# Patient Record
Sex: Female | Born: 1970 | Race: White | Hispanic: No | Marital: Married
Health system: Midwestern US, Community
[De-identification: ages and names within clinical notes are randomized; demographics above are authoritative.]

## PROBLEM LIST (undated history)

## (undated) DIAGNOSIS — J0100 Acute maxillary sinusitis, unspecified: Secondary | ICD-10-CM

## (undated) DIAGNOSIS — G4730 Sleep apnea, unspecified: Secondary | ICD-10-CM

## (undated) DIAGNOSIS — R51 Headache: Secondary | ICD-10-CM

## (undated) DIAGNOSIS — R519 Headache, unspecified: Secondary | ICD-10-CM

## (undated) DIAGNOSIS — J329 Chronic sinusitis, unspecified: Secondary | ICD-10-CM

## (undated) DIAGNOSIS — G4700 Insomnia, unspecified: Secondary | ICD-10-CM

## (undated) DIAGNOSIS — E559 Vitamin D deficiency, unspecified: Secondary | ICD-10-CM

## (undated) HISTORY — DX: Vitamin D deficiency, unspecified: E55.9

## (undated) HISTORY — DX: Insomnia, unspecified: G47.00

## (undated) HISTORY — DX: Acute maxillary sinusitis, unspecified: J01.00

## (undated) HISTORY — PX: ABDOMINAL HYSTERECTOMY: SHX81

## (undated) HISTORY — PX: APPENDECTOMY: SHX54

---

## 1997-12-16 HISTORY — PX: BREAST REDUCTION SURGERY: SHX8

## 2007-06-10 ENCOUNTER — Inpatient Hospital Stay: Payer: Self-pay | Admitting: Internal Medicine

## 2007-06-11 ENCOUNTER — Other Ambulatory Visit: Payer: Self-pay

## 2009-05-03 ENCOUNTER — Ambulatory Visit: Payer: Self-pay | Admitting: Cardiology

## 2009-05-03 ENCOUNTER — Ambulatory Visit: Payer: Self-pay | Admitting: Unknown Physician Specialty

## 2009-05-11 ENCOUNTER — Ambulatory Visit: Payer: Self-pay | Admitting: Unknown Physician Specialty

## 2009-06-29 ENCOUNTER — Inpatient Hospital Stay: Payer: Self-pay | Admitting: Unknown Physician Specialty

## 2010-06-07 ENCOUNTER — Ambulatory Visit: Payer: Self-pay | Admitting: Internal Medicine

## 2011-05-06 ENCOUNTER — Ambulatory Visit: Payer: Self-pay | Admitting: Family Medicine

## 2011-09-05 ENCOUNTER — Ambulatory Visit: Payer: Self-pay | Admitting: Family Medicine

## 2011-09-26 ENCOUNTER — Ambulatory Visit: Payer: Self-pay | Admitting: Family Medicine

## 2011-12-17 HISTORY — PX: GASTRIC BYPASS: SHX52

## 2012-12-16 HISTORY — PX: HERNIA REPAIR: SHX51

## 2014-05-10 ENCOUNTER — Ambulatory Visit: Payer: Self-pay | Admitting: Family Medicine

## 2014-07-05 ENCOUNTER — Ambulatory Visit: Payer: Self-pay | Admitting: Family Medicine

## 2016-05-20 ENCOUNTER — Encounter: Payer: Self-pay | Admitting: Family Medicine

## 2016-05-20 ENCOUNTER — Ambulatory Visit (INDEPENDENT_AMBULATORY_CARE_PROVIDER_SITE_OTHER): Payer: Managed Care, Other (non HMO) | Admitting: Family Medicine

## 2016-05-20 VITALS — BP 120/68 | HR 71 | Temp 98.2°F | Resp 20 | Ht 64.0 in | Wt 159.4 lb

## 2016-05-20 DIAGNOSIS — J309 Allergic rhinitis, unspecified: Secondary | ICD-10-CM | POA: Diagnosis not present

## 2016-05-20 DIAGNOSIS — E663 Overweight: Secondary | ICD-10-CM | POA: Insufficient documentation

## 2016-05-20 DIAGNOSIS — E559 Vitamin D deficiency, unspecified: Secondary | ICD-10-CM

## 2016-05-20 DIAGNOSIS — G43709 Chronic migraine without aura, not intractable, without status migrainosus: Secondary | ICD-10-CM | POA: Diagnosis not present

## 2016-05-20 DIAGNOSIS — Z9884 Bariatric surgery status: Secondary | ICD-10-CM

## 2016-05-20 DIAGNOSIS — Z9889 Other specified postprocedural states: Secondary | ICD-10-CM

## 2016-05-20 MED ORDER — SUMATRIPTAN SUCCINATE 6 MG/0.5ML ~~LOC~~ SOSY: 6.0000 mg | PREFILLED_SYRINGE | SUBCUTANEOUS | Status: DC | PRN

## 2016-05-20 MED ORDER — TOPIRAMATE 50 MG PO TABS: 50.0000 mg | ORAL_TABLET | Freq: Two times a day (BID) | ORAL | Status: DC

## 2016-05-20 MED ORDER — IBUPROFEN 800 MG PO TABS: 800.0000 mg | ORAL_TABLET | Freq: Three times a day (TID) | ORAL | Status: DC | PRN

## 2016-05-21 DIAGNOSIS — J309 Allergic rhinitis, unspecified: Secondary | ICD-10-CM | POA: Insufficient documentation

## 2016-05-21 DIAGNOSIS — G43909 Migraine, unspecified, not intractable, without status migrainosus: Secondary | ICD-10-CM | POA: Insufficient documentation

## 2016-05-21 DIAGNOSIS — E559 Vitamin D deficiency, unspecified: Secondary | ICD-10-CM | POA: Insufficient documentation

## 2016-05-21 LAB — COMPREHENSIVE METABOLIC PANEL
A/G RATIO: 1.8 (ref 1.2–2.2)
ALK PHOS: 90 IU/L (ref 39–117)
ALT: 19 IU/L (ref 0–32)
AST: 18 IU/L (ref 0–40)
Albumin: 4.6 g/dL (ref 3.5–5.5)
BUN/Creatinine Ratio: 14 (ref 9–23)
BUN: 9 mg/dL (ref 6–24)
Bilirubin Total: 0.7 mg/dL (ref 0.0–1.2)
CO2: 29 mmol/L (ref 18–29)
Calcium: 9.6 mg/dL (ref 8.7–10.2)
Chloride: 101 mmol/L (ref 96–106)
Creatinine, Ser: 0.64 mg/dL (ref 0.57–1.00)
GFR calc Af Amer: 126 mL/min/{1.73_m2} (ref >59)
GFR calc non Af Amer: 109 mL/min/{1.73_m2} (ref >59)
Globulin, Total: 2.5 g/dL (ref 1.5–4.5)
Glucose: 89 mg/dL (ref 65–99)
POTASSIUM: 4.9 mmol/L (ref 3.5–5.2)
SODIUM: 141 mmol/L (ref 134–144)
TOTAL PROTEIN: 7.1 g/dL (ref 6.0–8.5)

## 2016-05-21 LAB — CBC
Hematocrit: 42.5 % (ref 34.0–46.6)
Hemoglobin: 14.3 g/dL (ref 11.1–15.9)
MCH: 30.2 pg (ref 26.6–33.0)
MCHC: 33.6 g/dL (ref 31.5–35.7)
MCV: 90 fL (ref 79–97)
PLATELETS: 330 10*3/uL (ref 150–379)
RBC: 4.74 x10E6/uL (ref 3.77–5.28)
RDW: 13.9 % (ref 12.3–15.4)
WBC: 6.2 10*3/uL (ref 3.4–10.8)

## 2016-05-21 LAB — LIPID PANEL
Chol/HDL Ratio: 3.1 ratio units (ref 0.0–4.4)
Cholesterol, Total: 150 mg/dL (ref 100–199)
HDL: 48 mg/dL (ref >39)
LDL CALC: 88 mg/dL (ref 0–99)
Triglycerides: 72 mg/dL (ref 0–149)
VLDL CHOLESTEROL CAL: 14 mg/dL (ref 5–40)

## 2016-05-21 LAB — VITAMIN B12: Vitamin B-12: 744 pg/mL (ref 211–946)

## 2016-05-21 LAB — VITAMIN D 25 HYDROXY (VIT D DEFICIENCY, FRACTURES): Vit D, 25-Hydroxy: 26.6 ng/mL — ABNORMAL LOW (ref 30.0–100.0)

## 2016-05-21 LAB — TSH: TSH: 1.02 u[IU]/mL (ref 0.450–4.500)

## 2016-05-21 MED ORDER — VITAMIN D3 25 MCG (1000 UT) PO CAPS: 1.0000 | ORAL_CAPSULE | Freq: Every day | ORAL | Status: DC

## 2016-05-21 NOTE — Addendum Note (Signed)
Addended by: Schuyler AmorPLONK, Takeela Peil on: 05/21/2016 05:25 PM   Modules accepted: Orders

## 2016-05-21 NOTE — Progress Notes (Signed)
Date:  05/20/2016   Name:  Precious ReelKerri D Lamb   DOB:  01/07/1971   MRN:  [id number]  PCP:  Dennison MascotLemont Morrisey, MD    Chief Complaint: Migraine   History of Present Illness:  This is a 45 y.o. female with chronic migraines, requests refills on SQ Imitrex (oral ineffective). Having migraines every two weeks, on Topamax in past which helped. Also takes ibuprofen 800 mg prn headache. C/o AR sxs, has not tried oral antihistamine and steroid NS together. Hx gastric bypass with vit D def in past.  Review of Systems:  Review of Systems  Constitutional: Negative for fever and fatigue.  Respiratory: Negative for cough and shortness of breath.   Cardiovascular: Negative for chest pain and leg swelling.  Neurological: Negative for syncope and light-headedness.    Patient Active Problem List   Diagnosis Date Noted  . Migraines 05/21/2016  . Vitamin D deficiency 05/21/2016  . Allergic rhinitis 05/21/2016  . Overweight (BMI 25.0-29.9) 05/20/2016  . Hx of gastric bypass 05/20/2016    Prior to Admission medications   Medication Sig Start Date End Date Taking? Authorizing Provider  ibuprofen (ADVIL,MOTRIN) 800 MG tablet Take 1 tablet (800 mg total) by mouth every 8 (eight) hours as needed. 05/20/16   Schuyler AmorWilliam Lynsi Dooner, MD  SUMAtriptan (IMITREX) 6 MG/0.5ML SOSY injection Inject 0.5 mLs (6 mg total) into the skin every 2 (two) hours as needed for migraine or headache. F 05/20/16   Schuyler AmorWilliam Keyli Duross, MD  topiramate (TOPAMAX) 50 MG tablet Take 1 tablet (50 mg total) by mouth 2 (two) times daily. 05/20/16   Schuyler AmorWilliam Houda Brau, MD    Allergies  Allergen Reactions  . Sulfur Rash    Past Surgical History  Procedure Laterality Date  . Gastric bypass    . Abdominal hysterectomy    . Appendectomy    . Breast reduction surgery    . Gallbladder surgery      Social History  Substance Use Topics  . Smoking status: Never Smoker   . Smokeless tobacco: None  . Alcohol Use: No    Family History  Problem Relation Age of  Onset  . Cancer Mother   . Diabetes Father   . Hypertension Father   . Hyperlipidemia Father     Medication list has been reviewed and updated.  Physical Examination: BP 120/68 mmHg  Pulse 71  Temp(Src) 98.2 F (36.8 C)  Resp 20  Ht 5\' 4"  (1.626 m)  Wt 159 lb 6 oz (72.292 kg)  BMI 27.34 kg/m2  SpO2 98%  Physical Exam  Constitutional: She appears well-developed and well-nourished.  Cardiovascular: Normal rate, regular rhythm and normal heart sounds.   Pulmonary/Chest: Effort normal and breath sounds normal.  Musculoskeletal: She exhibits no edema.  Neurological: She is alert.  Skin: Skin is warm and dry.  Psychiatric: She has a normal mood and affect. Her behavior is normal.  Nursing note and vitals reviewed.   Assessment and Plan:  1. Chronic migraine without aura without status migrainosus, not intractable Refill SQ Imitrex and ibuprofen, restart Topamax  2. Overweight (BMI 25.0-29.9) S/p gastric bypass - TSH - Lipid Profile  3. Vitamin D deficiency Check level  4. Allergic rhinitis, unspecified allergic rhinitis type Try OTC Claritin/Flonase together, consider ENT referral if sxs persist  5. Hx of gastric bypass - B12 - Vitamin D (25 hydroxy) - Comprehensive Metabolic Panel (CMET) - CBC  Return in about 6 months (around 11/19/2016).  Dionne AnoWilliam M. Kingsley SpittlePlonk, Jr. MD [facility]  05/21/2016

## 2016-05-24 ENCOUNTER — Other Ambulatory Visit: Payer: Self-pay | Admitting: Family Medicine

## 2016-05-24 ENCOUNTER — Telehealth: Payer: Self-pay

## 2016-05-24 MED ORDER — VITAMIN D3 125 MCG (5000 UT) PO CAPS: 1.0000 | ORAL_CAPSULE | Freq: Every day | ORAL | Status: DC

## 2016-05-24 NOTE — Telephone Encounter (Signed)
Pt stated that she has been on OTC vitamin D 3000 units per day and she stated that she was diagnosed with osteopenia years ago and was placed on 50000 units and she wanted to know if she should go back to that dosage and if so if you could send it to her pharmacy

## 2016-05-24 NOTE — Telephone Encounter (Signed)
Recommend increase OTC vit D3 to 5000 units daily, don't think she needs rx vitamin D at this time

## 2016-05-27 NOTE — Telephone Encounter (Signed)
Pt informed

## 2016-12-03 ENCOUNTER — Other Ambulatory Visit: Payer: Self-pay | Admitting: Family Medicine

## 2016-12-16 HISTORY — PX: GALLBLADDER SURGERY: SHX652

## 2017-02-07 ENCOUNTER — Encounter: Payer: Self-pay | Admitting: Family Medicine

## 2017-02-07 ENCOUNTER — Ambulatory Visit (INDEPENDENT_AMBULATORY_CARE_PROVIDER_SITE_OTHER): Payer: BLUE CROSS/BLUE SHIELD | Admitting: Family Medicine

## 2017-02-07 VITALS — BP 124/82 | HR 89 | Temp 99.0°F | Resp 16 | Ht 64.0 in | Wt 155.8 lb

## 2017-02-07 DIAGNOSIS — J0100 Acute maxillary sinusitis, unspecified: Secondary | ICD-10-CM | POA: Insufficient documentation

## 2017-02-07 DIAGNOSIS — G43709 Chronic migraine without aura, not intractable, without status migrainosus: Secondary | ICD-10-CM | POA: Diagnosis not present

## 2017-02-07 HISTORY — DX: Acute maxillary sinusitis, unspecified: J01.00

## 2017-02-07 MED ORDER — SUMATRIPTAN SUCCINATE 6 MG/0.5ML ~~LOC~~ SOSY: 6.0000 mg | PREFILLED_SYRINGE | Freq: Once | SUBCUTANEOUS | 3 refills | Status: DC

## 2017-02-07 MED ORDER — FLUTICASONE PROPIONATE 50 MCG/ACT NA SUSP: 2.0000 | Freq: Every day | NASAL | 2 refills | Status: DC

## 2017-02-07 MED ORDER — IBUPROFEN 800 MG PO TABS: 800.0000 mg | ORAL_TABLET | Freq: Three times a day (TID) | ORAL | 0 refills | Status: DC | PRN

## 2017-02-07 MED ORDER — AZITHROMYCIN 250 MG PO TABS: ORAL_TABLET | ORAL | 0 refills | Status: DC

## 2017-02-07 NOTE — Progress Notes (Signed)
Name: Vanessa Callahan   MRN: [id number]    DOB: 06/08/1971   Date:02/07/2017       Progress Note  Subjective  Chief Complaint  Chief Complaint  Patient presents with  . Sinusitis    cough, congested, headache, ears itching, sore throat for 5 days    Sinusitis  This is a new problem. The current episode started in the past 7 days (4 days ago). The problem is unchanged. There has been no fever. Associated symptoms include congestion, headaches, sinus pressure, sneezing and a sore throat (drainage in her throat). Treatments tried: Claritin D, Zyrtec D, Dayquil cold and flu, and Nettipot. The treatment provided no relief.  She is also requesting refill for sumatriptan and ibuprofen, taken for chronic migraine headaches, sumatriptan helps relieve migraines when taken at onset.  Past Medical History:  Diagnosis Date  . Anxiety   . Depression   . Insomnia   . Vitamin D deficiency     Past Surgical History:  Procedure Laterality Date  . ABDOMINAL HYSTERECTOMY    . APPENDECTOMY    . BREAST REDUCTION SURGERY    . GALLBLADDER SURGERY    . GASTRIC BYPASS      Family History  Problem Relation Age of Onset  . Cancer Mother   . Diabetes Father   . Hypertension Father   . Hyperlipidemia Father     Social History   Social History  . Marital status: Married    Spouse name: N/A  . Number of children: N/A  . Years of education: N/A   Occupational History  . Not on file.   Social History Main Topics  . Smoking status: Never Smoker  . Smokeless tobacco: Never Used  . Alcohol use No  . Drug use: Unknown  . Sexual activity: Yes    Birth control/ protection: None   Other Topics Concern  . Not on file   Social History Narrative  . No narrative on file     Current Outpatient Prescriptions:  .  Cholecalciferol (VITAMIN D3) 5000 units CAPS, Take 1 capsule (5,000 Units total) by mouth daily., Disp: 30 capsule, Rfl:  .  ibuprofen (ADVIL,MOTRIN) 800 MG tablet, Take 1 tablet (800 mg  total) by mouth every 8 (eight) hours as needed., Disp: 30 tablet, Rfl: 0 .  SUMAtriptan (IMITREX) 6 MG/0.5ML SOSY injection, Inject 0.5 mLs (6 mg total) into the skin every 2 (two) hours as needed for migraine or headache. F (Patient not taking: Reported on 02/07/2017), Disp: 6 Syringe, Rfl: 3 .  topiramate (TOPAMAX) 50 MG tablet, Take 1 tablet (50 mg total) by mouth 2 (two) times daily. (Patient not taking: Reported on 02/07/2017), Disp: 60 tablet, Rfl: 2  Allergies  Allergen Reactions  . Sulfur Rash     Review of Systems  HENT: Positive for congestion, sinus pressure, sneezing and sore throat (drainage in her throat).   Neurological: Positive for headaches.     Objective  Vitals:   02/07/17 1054  BP: 124/82  Pulse: 89  Resp: 16  Temp: 99 F (37.2 C)  TempSrc: Oral  SpO2: 98%  Weight: 155 lb 12.8 oz (70.7 kg)  Height: 5\' 4"  (1.626 m)    Physical Exam  Constitutional: She is oriented to person, place, and time and well-developed, well-nourished, and in no distress.  HENT:  Right Ear: Tympanic membrane and ear canal normal. No drainage or swelling.  Left Ear: Tympanic membrane and ear canal normal. No drainage or swelling.  Nose: Right sinus  exhibits maxillary sinus tenderness. Right sinus exhibits no frontal sinus tenderness. Left sinus exhibits maxillary sinus tenderness. Left sinus exhibits no frontal sinus tenderness.  Mouth/Throat: Posterior oropharyngeal erythema present. No oropharyngeal exudate or posterior oropharyngeal edema.  Nasal mucosal inflammation, turbinates hypertrophied.  Cardiovascular: Normal rate, regular rhythm, S1 normal, S2 normal and normal heart sounds.   No murmur heard. Pulmonary/Chest: Effort normal and breath sounds normal. She has no wheezes. She has no rhonchi.  Neurological: She is alert and oriented to person, place, and time.  Psychiatric: Mood, memory, affect and judgment normal.  Nursing note and vitals reviewed.    Assessment &  Plan  1. Chronic migraine without aura without status migrainosus, not intractable  - SUMAtriptan (IMITREX) 6 MG/0.5ML SOSY injection; Inject 0.5 mLs (6 mg total) into the skin once.  Dispense: 6 Syringe; Refill: 3 - ibuprofen (ADVIL,MOTRIN) 800 MG tablet; Take 1 tablet (800 mg total) by mouth every 8 (eight) hours as needed for moderate pain.  Dispense: 30 tablet; Refill: 0  2. Acute non-recurrent maxillary sinusitis  - fluticasone (FLONASE ALLERGY RELIEF) 50 MCG/ACT nasal spray; Place 2 sprays into both nostrils daily.  Dispense: 16 g; Refill: 2 - azithromycin (ZITHROMAX) 250 MG tablet; 2 tabs po day 1, then 1 tab po q day x 4 days  Dispense: 6 each; Refill: 0   Jannet Calip Asad A. Faylene KurtzShah [facility] 02/07/2017 11:16 AM

## 2017-02-25 ENCOUNTER — Telehealth: Payer: Self-pay | Admitting: Family Medicine

## 2017-02-25 NOTE — Telephone Encounter (Signed)
PA was performed on 02/11/17, but it was denied by insurance. Patient must call them to find out why.

## 2017-02-25 NOTE — Telephone Encounter (Signed)
Pt states that sumatriptan is needing a authorization. She is asking that you give her a call once complete [phone]

## 2017-02-25 NOTE — Telephone Encounter (Signed)
Pt will call insurance to see which sumatriptan they will cover

## 2017-11-03 DIAGNOSIS — H18829 Corneal disorder due to contact lens, unspecified eye: Secondary | ICD-10-CM | POA: Diagnosis not present

## 2017-12-17 DIAGNOSIS — J329 Chronic sinusitis, unspecified: Secondary | ICD-10-CM

## 2017-12-17 HISTORY — DX: Chronic sinusitis, unspecified: J32.9

## 2017-12-29 DIAGNOSIS — Z113 Encounter for screening for infections with a predominantly sexual mode of transmission: Secondary | ICD-10-CM | POA: Diagnosis not present

## 2017-12-29 DIAGNOSIS — R102 Pelvic and perineal pain: Secondary | ICD-10-CM | POA: Diagnosis not present

## 2017-12-29 DIAGNOSIS — Z124 Encounter for screening for malignant neoplasm of cervix: Secondary | ICD-10-CM | POA: Diagnosis not present

## 2017-12-29 DIAGNOSIS — Z01419 Encounter for gynecological examination (general) (routine) without abnormal findings: Secondary | ICD-10-CM | POA: Diagnosis not present

## 2018-01-07 DIAGNOSIS — R102 Pelvic and perineal pain: Secondary | ICD-10-CM | POA: Diagnosis not present

## 2018-03-02 ENCOUNTER — Encounter: Payer: Self-pay | Admitting: Family Medicine

## 2018-03-02 ENCOUNTER — Ambulatory Visit: Payer: BLUE CROSS/BLUE SHIELD | Admitting: Family Medicine

## 2018-03-02 VITALS — BP 110/68 | HR 79 | Temp 98.4°F | Resp 16 | Ht 64.0 in | Wt 157.7 lb

## 2018-03-02 DIAGNOSIS — J040 Acute laryngitis: Secondary | ICD-10-CM

## 2018-03-02 DIAGNOSIS — Z1239 Encounter for other screening for malignant neoplasm of breast: Secondary | ICD-10-CM

## 2018-03-02 DIAGNOSIS — E049 Nontoxic goiter, unspecified: Secondary | ICD-10-CM

## 2018-03-02 DIAGNOSIS — E663 Overweight: Secondary | ICD-10-CM

## 2018-03-02 DIAGNOSIS — Z9884 Bariatric surgery status: Secondary | ICD-10-CM

## 2018-03-02 DIAGNOSIS — E559 Vitamin D deficiency, unspecified: Secondary | ICD-10-CM | POA: Diagnosis not present

## 2018-03-02 DIAGNOSIS — G43709 Chronic migraine without aura, not intractable, without status migrainosus: Secondary | ICD-10-CM

## 2018-03-02 DIAGNOSIS — Z1231 Encounter for screening mammogram for malignant neoplasm of breast: Secondary | ICD-10-CM

## 2018-03-02 DIAGNOSIS — Z13220 Encounter for screening for lipoid disorders: Secondary | ICD-10-CM

## 2018-03-02 DIAGNOSIS — H66001 Acute suppurative otitis media without spontaneous rupture of ear drum, right ear: Secondary | ICD-10-CM | POA: Diagnosis not present

## 2018-03-02 MED ORDER — BENZONATATE 100 MG PO CAPS: 100.0000 mg | ORAL_CAPSULE | Freq: Three times a day (TID) | ORAL | 0 refills | Status: DC | PRN

## 2018-03-02 MED ORDER — FLUTICASONE PROPIONATE 50 MCG/ACT NA SUSP: 2.0000 | Freq: Every day | NASAL | 1 refills | Status: DC

## 2018-03-02 MED ORDER — PREDNISONE 10 MG PO TABS: ORAL_TABLET | ORAL | 0 refills | Status: DC

## 2018-03-02 MED ORDER — IBUPROFEN 800 MG PO TABS: 800.0000 mg | ORAL_TABLET | Freq: Three times a day (TID) | ORAL | 0 refills | Status: DC | PRN

## 2018-03-02 MED ORDER — AMOXICILLIN 500 MG PO TABS: 500.0000 mg | ORAL_TABLET | Freq: Two times a day (BID) | ORAL | 0 refills | Status: AC

## 2018-03-02 MED ORDER — SUMATRIPTAN SUCCINATE 6 MG/0.5ML ~~LOC~~ SOSY: 6.0000 mg | PREFILLED_SYRINGE | Freq: Once | SUBCUTANEOUS | 1 refills | Status: DC

## 2018-03-02 NOTE — Progress Notes (Signed)
Name: Vanessa Callahan   MRN: [id number]    DOB: 1971-04-08   Date:03/02/2018       Progress Note  Subjective  Chief Complaint  Chief Complaint  Patient presents with  . URI    cough, congested, sore throat, hoarse for 6 days  . Ear Pain    HPI  URI: PT presents with concern for URI symptoms ongoing for 6 days.  She has an extremely hoarse voice, productive cough, RIGHT otalgia with radiation into the right neck, subjective fevers/chills. Denies chest pain, shortness of breath, abdominal pain, NVD.  Has been taking ibuprofen 800mg , mucinex and claritin - these have helped, but she is just not improving.  Migraines: She is in need of Imitrex and Ibuprofen refills.  Triggers are stress, her children causing her stress, sometimes tension.  She denies aura prior to migraine onset; symptoms usually include nausea and vomiting, fatigue, and photosensitivity.  She has been having about 2 migraines a month; used to think they were menstrual, but they have remained s/p hysterectomy (still has 1 ovary in place).  She takes Ibuprofen first - if this does not help, she will take imitrex.  Enlarged Thyroid: She has enlarged thyroid on examination today - she does not think she has ever had US performed in the past - after further investigation, she has had a NM study in 2011 - found slight enlargement of the RIGHT lobe and borderline hyperthyroidism.  We will check labs and order [place].  She endorses chronic cold intolerance.  Denies hair/skin/nail changes, denies constipation or diarrhea, or palpitations; no issues with thyroid in the past that she can remember.  Labs/History of Gastric Bypass: Always has low Vitamin D, she would like this rechecked today.  She is going to schedule a physical in the upcoming 1-2 months, and would like labs done today.    Patient Active Problem List   Diagnosis Date Noted  . Acute non-recurrent maxillary sinusitis 02/07/2017  . Migraines 05/21/2016  . Vitamin D deficiency  05/21/2016  . Allergic rhinitis 05/21/2016  . Overweight (BMI 25.0-29.9) 05/20/2016  . Hx of gastric bypass 05/20/2016    Social History   Tobacco Use  . Smoking status: Never Smoker  . Smokeless tobacco: Never Used  Substance Use Topics  . Alcohol use: No     Current Outpatient Medications:  .  ibuprofen (ADVIL,MOTRIN) 800 MG tablet, Take 1 tablet (800 mg total) by mouth every 8 (eight) hours as needed for moderate pain., Disp: 30 tablet, Rfl: 0 .  azithromycin (ZITHROMAX) 250 MG tablet, 2 tabs po day 1, then 1 tab po q day x 4 days (Patient not taking: Reported on 03/02/2018), Disp: 6 each, Rfl: 0 .  Cholecalciferol (VITAMIN D3) 5000 units CAPS, Take 1 capsule (5,000 Units total) by mouth daily. (Patient not taking: Reported on 03/02/2018), Disp: 30 capsule, Rfl:  .  fluticasone (FLONASE ALLERGY RELIEF) 50 MCG/ACT nasal spray, Place 2 sprays into both nostrils daily. (Patient not taking: Reported on 03/02/2018), Disp: 16 g, Rfl: 2 .  SUMAtriptan (IMITREX) 6 MG/0.5ML SOSY injection, Inject 0.5 mLs (6 mg total) into the skin once., Disp: 6 Syringe, Rfl: 3 .  topiramate (TOPAMAX) 50 MG tablet, Take 1 tablet (50 mg total) by mouth 2 (two) times daily. (Patient not taking: Reported on 02/07/2017), Disp: 60 tablet, Rfl: 2  Allergies  Allergen Reactions  . Sulfur Rash    ROS  Ten systems reviewed and is negative except as mentioned in HPI  Objective  Vitals:   03/02/18 0919  BP: 110/68  Pulse: 79  Resp: 16  Temp: 98.4 F (36.9 C)  TempSrc: Oral  SpO2: 98%  Weight: 157 lb 11.2 oz (71.5 kg)  Height: 5\' 4"  (1.626 m)   Body mass index is 27.07 kg/m.  Nursing Note and Vital Signs reviewed.  Physical Exam  Constitutional: Patient appears well-developed and well-nourished. Obese. No distress.  HEENT: head atraumatic, normocephalic, pupils equal and reactive to light, EOM's intact, RIGHT TM bulging with purulent appearance, erythema present, no maxillary or frontal sinus  tenderness , neck supple without with mild submandibular lymphadenopathy, oropharynx pink and moist without exudate.  Notable goiter present which is particularly larger on the right side, no nodules are palpable. Cardiovascular: Normal rate, regular rhythm, S1/S2 present.  No murmur or rub heard. No BLE edema. Pulmonary/Chest: Effort normal and breath sounds clear. No respiratory distress or retractions. Psychiatric: Patient has a normal mood and affect. behavior is normal. Judgment and thought content normal. Neurological: she is alert and oriented to person, place, and time. No cranial nerve deficit. Coordination, balance, strength, speech and gait are normal.  Skin: Skin is warm and dry. No rash noted. No erythema.    No results found for this or any previous visit (from the past 72 hour(s)).  Assessment & Plan  1. Laryngitis - predniSONE (DELTASONE) 10 MG tablet; Day1:5tabs, Day2:4tabs, Day3:3tabs, Day4:2tabs, Day5:1tab  Dispense: 15 tablet; Refill: 0 - amoxicillin (AMOXIL) 500 MG tablet; Take 1 tablet (500 mg total) by mouth 2 (two) times daily for 10 days.  Dispense: 20 tablet; Refill: 0 - benzonatate (TESSALON) 100 MG capsule; Take 1-2 capsules (100-200 mg total) by mouth 3 (three) times daily as needed for cough.  Dispense: 30 capsule; Refill: 0  2. Acute suppurative otitis media of right ear without spontaneous rupture of tympanic membrane, recurrence not specified - fluticasone (FLONASE) 50 MCG/ACT nasal spray; Place 2 sprays into both nostrils daily.  Dispense: 16 g; Refill: 1 - amoxicillin (AMOXIL) 500 MG tablet; Take 1 tablet (500 mg total) by mouth 2 (two) times daily for 10 days.  Dispense: 20 tablet; Refill: 0  3. Enlarged thyroid gland - TSH - US THYROID; Future  4. Chronic migraine without aura without status migrainosus, not intractable - SUMAtriptan (IMITREX) 6 MG/0.5ML SOSY injection; Inject 0.5 mLs (6 mg total) into the skin once for 1 dose.  Dispense: 6 Syringe;  Refill: 1 - ibuprofen (ADVIL,MOTRIN) 800 MG tablet; Take 1 tablet (800 mg total) by mouth every 8 (eight) hours as needed for headache or moderate pain.  Dispense: 60 tablet; Refill: 0  5. Breast cancer screening - MM DIGITAL SCREENING BILATERAL; Future  6. Hx of gastric bypass - VITAMIN D 25 Hydroxy (Vit-D Deficiency, Fractures) - CBC - COMPLETE METABOLIC PANEL WITH GFR  7. Overweight (BMI 25.0-29.9) - Lipid panel - COMPLETE METABOLIC PANEL WITH GFR  8. Vitamin D deficiency - VITAMIN D 25 Hydroxy (Vit-D Deficiency, Fractures)  9. Screening for hyperlipidemia - Lipid panel   -Red flags and when to present for emergency care or RTC including fever >101.19F, chest pain, shortness of breath, new/worsening/un-resolving symptoms, reviewed with patient at time of visit. Follow up and care instructions discussed and provided in AVS.

## 2018-03-03 LAB — COMPLETE METABOLIC PANEL WITH GFR
AG RATIO: 1.6 (calc) (ref 1.0–2.5)
ALKALINE PHOSPHATASE (APISO): 90 U/L (ref 33–115)
ALT: 21 U/L (ref 6–29)
AST: 15 U/L (ref 10–35)
Albumin: 4.6 g/dL (ref 3.6–5.1)
BUN: 10 mg/dL (ref 7–25)
CO2: 28 mmol/L (ref 20–32)
Calcium: 9.8 mg/dL (ref 8.6–10.2)
Chloride: 106 mmol/L (ref 98–110)
Creat: 0.58 mg/dL (ref 0.50–1.10)
GFR, EST NON AFRICAN AMERICAN: 111 mL/min/{1.73_m2} (ref >=60)
GFR, Est African American: 128 mL/min/{1.73_m2} (ref >=60)
GLOBULIN: 2.8 g/dL (ref 1.9–3.7)
Glucose, Bld: 93 mg/dL (ref 65–139)
POTASSIUM: 4.3 mmol/L (ref 3.5–5.3)
SODIUM: 141 mmol/L (ref 135–146)
Total Bilirubin: 0.5 mg/dL (ref 0.2–1.2)
Total Protein: 7.4 g/dL (ref 6.1–8.1)

## 2018-03-03 LAB — LIPID PANEL
CHOLESTEROL: 135 mg/dL (ref <200)
HDL: 41 mg/dL — ABNORMAL LOW (ref >50)
LDL CHOLESTEROL (CALC): 80 mg/dL
Non-HDL Cholesterol (Calc): 94 mg/dL (calc) (ref <130)
TRIGLYCERIDES: 65 mg/dL (ref <150)
Total CHOL/HDL Ratio: 3.3 (calc) (ref <5.0)

## 2018-03-03 LAB — CBC
HEMATOCRIT: 39.3 % (ref 35.0–45.0)
Hemoglobin: 13.6 g/dL (ref 11.7–15.5)
MCH: 30.4 pg (ref 27.0–33.0)
MCHC: 34.6 g/dL (ref 32.0–36.0)
MCV: 87.7 fL (ref 80.0–100.0)
MPV: 11.7 fL (ref 7.5–12.5)
Platelets: 301 10*3/uL (ref 140–400)
RBC: 4.48 10*6/uL (ref 3.80–5.10)
RDW: 12.4 % (ref 11.0–15.0)
WBC: 9 10*3/uL (ref 3.8–10.8)

## 2018-03-03 LAB — VITAMIN D 25 HYDROXY (VIT D DEFICIENCY, FRACTURES): Vit D, 25-Hydroxy: 18 ng/mL — ABNORMAL LOW (ref 30–100)

## 2018-03-03 LAB — TSH: TSH: 1.07 mIU/L

## 2018-03-24 ENCOUNTER — Telehealth: Payer: Self-pay

## 2018-03-24 NOTE — Telephone Encounter (Signed)
Copied from CRM [id number]. Topic: Referral - Status >> Mar 24, 2018  2:12 PM Leafy RoRobinson, Norma J wrote: Reason for CRM: pt is calling back checking on status of ultrasound thyroid  The order was sent to scheduling but since it was put in as a routine, it could take up to 30 days.

## 2018-04-06 ENCOUNTER — Ambulatory Visit
Admission: RE | Admit: 2018-04-06 | Discharge: 2018-04-06 | Disposition: A | Discharge status: Home / Self Care | Payer: BLUE CROSS/BLUE SHIELD | Source: Ambulatory Visit | Attending: Family Medicine | Admitting: Family Medicine

## 2018-04-06 DIAGNOSIS — E049 Nontoxic goiter, unspecified: Secondary | ICD-10-CM | POA: Insufficient documentation

## 2018-04-06 DIAGNOSIS — E041 Nontoxic single thyroid nodule: Secondary | ICD-10-CM | POA: Diagnosis not present

## 2018-08-30 ENCOUNTER — Inpatient Hospital Stay
Admit: 2018-08-30 | Discharge: 2018-08-30 | Disposition: A | Discharge status: Home / Self Care | Payer: BLUE CROSS/BLUE SHIELD | Attending: Emergency Medicine

## 2018-08-30 DIAGNOSIS — R51 Headache: Secondary | ICD-10-CM

## 2018-08-30 DIAGNOSIS — R110 Nausea: Secondary | ICD-10-CM | POA: Diagnosis not present

## 2018-08-30 MED ORDER — SUMATRIPTAN 6 MG/0.5 ML SUB-Q
6 mg/0.5 mL | SUBCUTANEOUS | Status: AC
Start: 2018-08-30 — End: 2018-08-30
  Administered 2018-08-30: 14:00:00 via SUBCUTANEOUS

## 2018-08-30 MED FILL — SUMATRIPTAN 6 MG/0.5 ML SUB-Q: 6 mg/0.5 mL | SUBCUTANEOUS | Qty: 0.5

## 2018-08-30 NOTE — ED Triage Notes (Signed)
Pt states she has a hx of migraines, states she has had a migraine since Friday, prescription medications taken at home without relief.

## 2018-08-30 NOTE — ED Notes (Signed)
Pt states she has a hx of migraines, states she has had a migraine since Friday, prescription medications taken at home without relief.

## 2018-08-30 NOTE — ED Notes (Signed)
 I have reviewed discharge instructions with the patient.  The patient verbalized understanding.    Patient left ED via Discharge Method: ambulatory to Home with spouse.    Opportunity for questions and clarification provided.       Patient given 0 scripts.         To continue your aftercare when you leave the hospital, you may receive an automated call from our care team to check in on how you are doing.  This is a free service and part of our promise to provide the best care and service to meet your aftercare needs." If you have questions, or wish to unsubscribe from this service please call [phone].  Thank you for Choosing our [facility] Emergency Department.

## 2018-08-30 NOTE — ED Provider Notes (Signed)
[facility]     Gershon MusselKerri Denise Annie Potter is a 47 y.o. female seen on 08/30/2018 at 9:46 AM [facility] in room ER02/02.    Chief Complaint   Patient presents with   ??? Headache     HPI: 47 year old female with a history of migraine headaches presenting to the emergency department complaining of a headache.  She states it began that on Friday.  It is generalized and feels like pressure.  Is gotten progressively worse.  She takes sumatriptan injections at home and took one on Friday but states that she is concerned that she may have not deliver the medication appropriately and not gotten the full dose.  She states typically her headaches resolved almost immediately with sumatriptan.  However this time when she took the injection she did not feel "like the blood vessels dilated completely".  She has had some nausea and dry heaving but no vomiting.  No fevers.  Her headache was gradual in onset.  It feels similar to all of her previous migraine headaches.  Since she has had a prescription for the subcutaneous sumatriptan she has not had to go to the emergency department for headache in the last 3 years.  However she is traveling, having left town shortly after taking her sumatriptan.  [place]  She does not have a repeat dose to give herself at this time.  Her headache is worse with light exposure.  Also loud noises make her headache worse.    Historian: Patient    REVIEW OF SYSTEMS     Review of Systems   Constitutional: Negative for fever.   HENT: Negative.    Eyes: Negative.  Negative for visual disturbance.   Respiratory: Negative for cough, chest tightness, shortness of breath and wheezing.    Cardiovascular: Negative for chest pain.   Gastrointestinal: Positive for nausea. Negative for abdominal distention, abdominal pain, constipation, diarrhea and vomiting.   Endocrine: Negative.    Genitourinary: Negative for dysuria, flank pain, frequency and  urgency.   Neurological: Positive for headaches. Negative for dizziness and syncope.   Psychiatric/Behavioral: Negative.    All other systems reviewed and are negative.      PAST MEDICAL HISTORY     No past medical history on file.  No past surgical history on file.  Social History     Socioeconomic History   ??? Marital status: MARRIED     Spouse name: Not on file   ??? Number of children: Not on file   ??? Years of education: Not on file   ??? Highest education level: Not on file     None     No Known Allergies     PHYSICAL EXAM       Vitals:    08/30/18 0931   BP: (!) 172/94   Pulse: 71   Resp: 16   Temp: 97.8 ??F (36.6 ??C)   SpO2: 100%    Vital signs were reviewed.     Physical Exam   Constitutional: She is oriented to person, place, and time. She appears well-developed and well-nourished. No distress.   HENT:   Head: Normocephalic and atraumatic.   Eyes: Pupils are equal, round, and reactive to light. EOM are normal.   Neck: Normal range of motion. Neck supple.   No meningismus   Cardiovascular: Normal rate, regular rhythm, normal heart sounds and intact distal pulses.   No murmur heard.  Pulmonary/Chest: Effort normal and  breath sounds normal. No respiratory distress. She has no wheezes. She has no rales.   Abdominal: Soft. She exhibits no distension. There is no tenderness. There is no rebound.   Musculoskeletal: She exhibits no edema, tenderness or deformity.   Neurological: She is alert and oriented to person, place, and time. She has normal strength. No cranial nerve deficit or sensory deficit. She exhibits normal muscle tone. She displays a negative Romberg sign. Coordination and gait normal. GCS eye subscore is 4. GCS verbal subscore is 5. GCS motor subscore is 6.   5+ strength in all distributions, sensation intact, finger-nose-finger intact   Skin: Skin is warm and dry. Capillary refill takes less than 2 seconds. No erythema.   Psychiatric: She has a normal mood and affect. Her behavior is normal.   Vitals  reviewed.       MEDICAL DECISION MAKING     ED Course:  Patient presents to the ER with headache similar to previous episodes. She states that sumatriptan always works for aborting her HAs. Neurological evaluation is unremarkable and reassuring. Headache was gradual in onset. I have very low suspicion for SAH, dissection, or other concerning cause of headache and do not feel that neuroimaging is indicated at this time.    10:20 AM  HA is abating. Pt feeling much better, ready to dc to home.    Disposition:  Dc to home  Diagnosis:  Headache, unspecified  ____________________________________________________________________  A portion of this note was generated using voice recognition dictation software. While the note has been reviewed for accuracy, please note certain words and phrases may not be transcribed as intended and some grammatical and/or typographical errors may be present.

## 2018-08-30 NOTE — ED Notes (Signed)
I have reviewed discharge instructions with the patient.  The patient verbalized understanding.    Patient left ED via Discharge Method: ambulatory to Home with spouse  Opportunity for questions and clarification provided.       Patient given 0 scripts.         To continue your aftercare when you leave the hospital, you may receive an automated call from our care team to check in on how you are doing.  This is a free service and part of our promise to provide the best care and service to meet your aftercare needs.??? If you have questions, or wish to unsubscribe from this service please call [phone].  Thank you for Choosing our [place] Emergency Department.

## 2018-08-30 NOTE — ED Provider Notes (Addendum)
[facility]     Gershon MusselKerri Denise Annie Potter is a 47 y.o. female seen on 08/30/2018 at 9:46 AM in [facility] in room ER02/02.    Chief Complaint   Patient presents with   ??? Headache     HPI: 47 year old female with a history of migraine headaches presenting to the emergency department complaining of a headache.  She states it began that on Friday.  It is generalized and feels like pressure.  Is gotten progressively worse.  She takes sumatriptan injections at home and took one on Friday but states that she is concerned that she may have not deliver the medication appropriately and not gotten the full dose.  She states typically her headaches resolved almost immediately with sumatriptan.  However this time when she took the injection she did not feel "like the blood vessels dilated completely".  She has had some nausea and dry heaving but no vomiting.  No fevers.  Her headache was gradual in onset.  It feels similar to all of her previous migraine headaches.  Since she has had a prescription for the subcutaneous sumatriptan she has not had to go to the emergency department for headache in the last 3 years.  However she is traveling, having left town shortly after taking her sumatriptan.  [place]  She does not have a repeat dose to give herself at this time.  Her headache is worse with light exposure.  Also loud noises make her headache worse.    Historian: Patient    REVIEW OF SYSTEMS     Review of Systems   Constitutional: Negative for fever.   HENT: Negative.    Eyes: Negative.  Negative for visual disturbance.   Respiratory: Negative for cough, chest tightness, shortness of breath and wheezing.    Cardiovascular: Negative for chest pain.   Gastrointestinal: Positive for nausea. Negative for abdominal distention, abdominal pain, constipation, diarrhea and vomiting.   Endocrine: Negative.     Genitourinary: Negative for dysuria, flank pain, frequency and urgency.   Neurological: Positive for headaches. Negative for dizziness and syncope.   Psychiatric/Behavioral: Negative.    All other systems reviewed and are negative.      PAST MEDICAL HISTORY     No past medical history on file.  No past surgical history on file.  Social History     Socioeconomic History   ??? Marital status: MARRIED     Spouse name: Not on file   ??? Number of children: Not on file   ??? Years of education: Not on file   ??? Highest education level: Not on file     None     No Known Allergies     PHYSICAL EXAM       Vitals:    08/30/18 0931   BP: (!) 172/94   Pulse: 71   Resp: 16   Temp: 97.8 ??F (36.6 ??C)   SpO2: 100%    Vital signs were reviewed.     Physical Exam   Constitutional: She is oriented to person, place, and time. She appears well-developed and well-nourished. No distress.   HENT:   Head: Normocephalic and atraumatic.   Eyes: Pupils are equal, round, and reactive to light. EOM are normal.   Neck: Normal range of motion. Neck supple.   No meningismus   Cardiovascular: Normal rate, regular rhythm, normal heart sounds and intact distal pulses.   No murmur heard.  Pulmonary/Chest: Effort normal and  breath sounds normal. No respiratory distress. She has no wheezes. She has no rales.   Abdominal: Soft. She exhibits no distension. There is no tenderness. There is no rebound.   Musculoskeletal: She exhibits no edema, tenderness or deformity.   Neurological: She is alert and oriented to person, place, and time. She has normal strength. No cranial nerve deficit or sensory deficit. She exhibits normal muscle tone. She displays a negative Romberg sign. Coordination and gait normal. GCS eye subscore is 4. GCS verbal subscore is 5. GCS motor subscore is 6.   5+ strength in all distributions, sensation intact, finger-nose-finger intact   Skin: Skin is warm and dry. Capillary refill takes less than 2 seconds. No erythema.    Psychiatric: She has a normal mood and affect. Her behavior is normal.   Vitals reviewed.       MEDICAL DECISION MAKING     ED Course:  Patient presents to the ER with headache similar to previous episodes. She states that sumatriptan always works for aborting her HAs. Neurological evaluation is unremarkable and reassuring. Headache was gradual in onset. I have very low suspicion for SAH, dissection, or other concerning cause of headache and do not feel that neuroimaging is indicated at this time.    10:20 AM  HA is abating. Pt feeling much better, ready to dc to home.    Disposition:  Dc to home  Diagnosis:  Headache, unspecified  ____________________________________________________________________  A portion of this note was generated using voice recognition dictation software. While the note has been reviewed for accuracy, please note certain words and phrases may not be transcribed as intended and some grammatical and/or typographical errors may be present.

## 2018-08-31 ENCOUNTER — Encounter: Payer: Self-pay | Admitting: Family Medicine

## 2018-08-31 ENCOUNTER — Ambulatory Visit: Payer: BLUE CROSS/BLUE SHIELD | Admitting: Family Medicine

## 2018-08-31 VITALS — BP 138/84 | HR 81 | Temp 98.5°F | Resp 16 | Ht 65.5 in | Wt 160.1 lb

## 2018-08-31 DIAGNOSIS — E049 Nontoxic goiter, unspecified: Secondary | ICD-10-CM | POA: Diagnosis not present

## 2018-08-31 DIAGNOSIS — G43709 Chronic migraine without aura, not intractable, without status migrainosus: Secondary | ICD-10-CM

## 2018-08-31 MED ORDER — TRIAMCINOLONE ACETONIDE 40 MG/ML IJ SUSP
40.0000 mg | Freq: Once | INTRAMUSCULAR | Status: AC
  Administered 2018-08-31: 40 mg via INTRAMUSCULAR

## 2018-08-31 MED ORDER — SUMATRIPTAN SUCCINATE 6 MG/0.5ML ~~LOC~~ SOSY: 6.0000 mg | PREFILLED_SYRINGE | Freq: Once | SUBCUTANEOUS | 1 refills | Status: DC

## 2018-08-31 MED ORDER — PREDNISONE 20 MG PO TABS: 20.0000 mg | ORAL_TABLET | Freq: Two times a day (BID) | ORAL | 0 refills | Status: AC

## 2018-08-31 MED ORDER — BUTALBITAL-APAP-CAFFEINE 50-325-40 MG PO TABS: 1.0000 | ORAL_TABLET | Freq: Three times a day (TID) | ORAL | 0 refills | Status: DC | PRN

## 2018-08-31 MED ORDER — TIZANIDINE HCL 4 MG PO TABS: 4.0000 mg | ORAL_TABLET | Freq: Four times a day (QID) | ORAL | 0 refills | Status: DC | PRN

## 2018-08-31 NOTE — Progress Notes (Addendum)
Name: Vanessa Callahan   MRN: [id number]    DOB: 25-Nov-1971   Date:08/31/2018       Progress Note  Subjective  Chief Complaint  Chief Complaint  Patient presents with  . Migraine    for 4 days seen in ER    HPI  Pt presents with concern for Migraine x4 days - started Friday 08/28/2018 - gave herself her imitrex which took the edge off only.  Saturday, took claritin D, ibuprofen, benadryl, BC powder, and a Max Freeze topical application to her shoulders.  Sunday she went to ER in [place], [place] - she was vomiting, had severe pain.  She was given an additional Imitrex dose. - Currently: Intermittent nausea, headache feels like pressure; she is photo/phonosensitive at this time. RIGHT neck feels tight to her.  - Hx gastric bypass - she has been taking a lot of NSAIDS with current migraine, taking with food; no blood in stool, dark and tarry stools, or abdominal pain.  Advised to try to avoid PO NSAIDS if possible.  - Denies slurred speech, vision changes, confusion, extremity weakness, facial droop, or gait disturbance.  Enlarged RIGHT-sided thyroid gland - she had normal TSH in March 2019; she has [place] that found nodule in April 2019 that stated she did not need to proceed with follow up or biopsy.  She would really like a second opinion - advised an endocrinology surgeon may provide second opinion - we will refer to Dr. Lady Gary .  Patient Active Problem List   Diagnosis Date Noted  . Acute non-recurrent maxillary sinusitis 02/07/2017  . Migraines 05/21/2016  . Vitamin D deficiency 05/21/2016  . Allergic rhinitis 05/21/2016  . Overweight (BMI 25.0-29.9) 05/20/2016  . Hx of gastric bypass 05/20/2016    Social History   Tobacco Use  . Smoking status: Never Smoker  . Smokeless tobacco: Never Used  Substance Use Topics  . Alcohol use: No     Current Outpatient Medications:  .  fluticasone (FLONASE) 50 MCG/ACT nasal spray, Place 2 sprays into both nostrils daily., Disp: 16 g, Rfl:  1 .  butalbital-acetaminophen-caffeine (FIORICET, ESGIC) 50-325-40 MG tablet, Take 1-2 tablets by mouth every 8 (eight) hours as needed for migraine., Disp: 15 tablet, Rfl: 0 .  predniSONE (DELTASONE) 20 MG tablet, Take 1 tablet (20 mg total) by mouth 2 (two) times daily with a meal for 1 day., Disp: 2 tablet, Rfl: 0 .  SUMAtriptan (IMITREX) 6 MG/0.5ML SOSY injection, Inject 0.5 mLs (6 mg total) into the skin once for 1 dose., Disp: 6 Syringe, Rfl: 1 .  tiZANidine (ZANAFLEX) 4 MG tablet, Take 1 tablet (4 mg total) by mouth every 6 (six) hours as needed for muscle spasms., Disp: 10 tablet, Rfl: 0  Allergies  Allergen Reactions  . Sulfur Rash    I personally reviewed active problem list, medication list, allergies, health maintenance, imaging with the patient/caregiver today.  ROS  Constitutional: Negative for fever or weight change.  Respiratory: Negative for cough and shortness of breath.   Cardiovascular: Negative for chest pain or palpitations.  Gastrointestinal: Negative for abdominal pain, no bowel changes.  Musculoskeletal: Negative for gait problem or joint swelling.  Skin: Negative for rash.  Neurological: Negative for dizziness or headache.  No other specific complaints in a complete review of systems (except as listed in HPI above).  Objective  Vitals:   08/31/18 1253 08/31/18 1340  BP: (!) 158/98 138/84  Pulse: 81   Resp: 16   Temp: 98.5 F (  36.9 C)   TempSrc: Oral   SpO2: 99%   Weight: 160 lb 1.6 oz (72.6 kg)   Height: 5' 5.5" (1.664 m)    Likely secondary to pain - BP is elevated today, but came down on reassessment.  Body mass index is 26.24 kg/m.  Nursing Note and Vital Signs reviewed.  Physical Exam  Constitutional: Patient appears well-developed and well-nourished. No distress.  HENT: Head: Normocephalic and atraumatic. Ears: bilateral TMs with no erythema or effusion; Nose: Nose normal. Mouth/Throat: Oropharynx is clear and moist. No oropharyngeal  exudate or tonsillar swelling.  Eyes: Conjunctivae and EOM are normal. No scleral icterus.  Pupils are equal, round, and reactive to light.  Neck: Normal range of motion. Neck supple. No JVD present. RIGHT sided thyromegaly present; unable to palpate nodule.  Cardiovascular: Normal rate, regular rhythm and normal heart sounds.  No murmur heard.  Pulmonary/Chest: Effort normal and breath sounds normal. No respiratory distress.. Musculoskeletal: Normal range of motion, no joint effusions. No gross deformities Neurological: Pt is alert and oriented to person, place, and time. No cranial nerve deficit. No nystagmus. Coordination, balance, strength, speech and gait are normal.  Skin: Skin is warm and dry. No rash noted. No erythema.  Psychiatric: Patient has a normal mood and affect. behavior is normal. Judgment and thought content normal.  No results found for this or any previous visit (from the past 72 hour(s)).  Assessment & Plan  1. Chronic migraine without aura without status migrainosus, not intractable - tiZANidine (ZANAFLEX) 4 MG tablet; Take 1 tablet (4 mg total) by mouth every 6 (six) hours as needed for muscle spasms.  Dispense: 10 tablet; Refill: 0 - butalbital-acetaminophen-caffeine (FIORICET, ESGIC) 50-325-40 MG tablet; Take 1-2 tablets by mouth every 8 (eight) hours as needed for migraine.  Dispense: 15 tablet; Refill: 0 - SUMAtriptan (IMITREX) 6 MG/0.5ML SOSY injection; Inject 0.5 mLs (6 mg total) into the skin once for 1 dose.  Dispense: 6 Syringe; Refill: 1 - triamcinolone acetonide (KENALOG-40) injection 40 mg - predniSONE (DELTASONE) 20 MG tablet; Take 1 tablet (20 mg total) by mouth 2 (two) times daily with a meal for 1 day.  Dispense: 2 tablet; Refill: 0  2. Enlarged thyroid gland - Ambulatory referral to General Surgery   - Discussed health maintenance items that are overdue, pt states she is still paying off her thyroid US and does not want to have any additional  screening or visits at this time.  -Red flags and when to present for emergency care or RTC including fever >101.38F, chest pain, shortness of breath, new/worsening/un-resolving symptoms, signs and symptoms of stroke reviewed with patient at time of visit. Follow up and care instructions discussed and provided in AVS.

## 2018-09-07 ENCOUNTER — Telehealth: Payer: Self-pay | Admitting: Family Medicine

## 2018-09-07 DIAGNOSIS — G43919 Migraine, unspecified, intractable, without status migrainosus: Secondary | ICD-10-CM

## 2018-09-07 NOTE — Telephone Encounter (Signed)
If medications provided are not helping her migraine, she needs to go to ER for further evaluation.

## 2018-09-07 NOTE — Telephone Encounter (Signed)
Please advise 

## 2018-09-07 NOTE — Telephone Encounter (Signed)
Copied from CRM [phone]#[id number]. Topic: General - Other >> Sep 07, 2018 10:57 AM Leafy Roobinson, Norma J wrote: Reason for CRM: pt is calling and would like something stronger for muscle spasms than tizanidine. [facility] [address]

## 2018-09-08 ENCOUNTER — Encounter: Payer: Self-pay | Admitting: General Surgery

## 2018-09-09 NOTE — Telephone Encounter (Signed)
She still have a headache. Had a massage and saw a chiropractor (neck and shoulder out of line) WOuld like another muscle relaxer

## 2018-09-10 MED ORDER — CYCLOBENZAPRINE HCL 10 MG PO TABS: 5.0000 mg | ORAL_TABLET | Freq: Three times a day (TID) | ORAL | 0 refills | Status: DC | PRN

## 2018-09-10 NOTE — Telephone Encounter (Signed)
Patient notified to try the Flexeril sent in by Maurice Small, FNP and if it does not help to go to ER or come back in follow up. Patient states she will pick it up today and try this medication. If it does not resolve she will be in Monday since her husband has an appointment.

## 2018-09-10 NOTE — Telephone Encounter (Signed)
I will send in flexeril, however if her migraine is still ongoing and has not improved, I strongly recommend ER for further evaluation or coming in to our office for follow up.

## 2018-09-10 NOTE — Addendum Note (Signed)
Addended by: Doren Custard on: 09/10/2018 09:22 AM   Modules accepted: Orders

## 2018-10-19 ENCOUNTER — Other Ambulatory Visit: Payer: Self-pay | Admitting: General Surgery

## 2018-10-19 ENCOUNTER — Ambulatory Visit (INDEPENDENT_AMBULATORY_CARE_PROVIDER_SITE_OTHER): Payer: BLUE CROSS/BLUE SHIELD | Admitting: General Surgery

## 2018-10-19 ENCOUNTER — Encounter: Payer: Self-pay | Admitting: *Deleted

## 2018-10-19 ENCOUNTER — Encounter: Payer: Self-pay | Admitting: General Surgery

## 2018-10-19 ENCOUNTER — Ambulatory Visit: Payer: Self-pay | Admitting: General Surgery

## 2018-10-19 ENCOUNTER — Other Ambulatory Visit: Payer: Self-pay

## 2018-10-19 VITALS — BP 152/96 | HR 62 | Temp 97.7°F | Ht 65.5 in | Wt 157.8 lb

## 2018-10-19 DIAGNOSIS — E041 Nontoxic single thyroid nodule: Secondary | ICD-10-CM | POA: Diagnosis not present

## 2018-10-19 NOTE — Patient Instructions (Addendum)
Patient needs to have labs completed. She will call Dr. Lady Gary to discuss thyroid surgery. Patient is to follow up in 6 months she is to call the office with any questions or concerns.  Thyroid Lobectomy A thyroid lobectomy is a surgery to remove a part or a whole section (lobe) of the thyroid gland. The thyroid gland is a butterfly-shaped gland at the base of your neck. It produces a substance that helps to control certain body processes (thyroid hormone). The thyroid gland has two lobes, one on each side of the windpipe (trachea). The lobes joined together by a section of tissue (thyroid isthmus). You may have a thyroid lobectomy:  To remove a noncancerous (benign) tumor or a cancerous (malignant) tumor of the thyroid.  To control thyroid hormone overproduction (hyperthyroidism).  You may have:  A conventional thyroid lobectomy (open lobectomy). This is the most common type. In this procedure, the thyroid gland is removed through one surgical cut (incision) in the neck.  An endoscopic thyroid lobectomy. This procedure is less invasive. It requires several smaller incisions in the neck, chest, or armpit. The surgeon uses a tiny camera and other assistive tools to operate on the thyroid gland.  Tell a health care provider about:  Any allergies you have.  All medicines you are taking, including vitamins, herbs, eye drops, creams, and over-the-counter medicines.  Any problems you or family members have had with anesthetic medicines.  Any blood disorders you have.  Any surgeries you have had.  Any medical conditions you have. What are the risks? Generally this is a safe procedure. However, problems can occur and include:  Infection.  Bleeding.  Hoarseness or vocal cord damage.  Difficulty swallowing or eating.  Damage to the glands that control the calcium level in your body (parathyroid glands).  Scar formation.  Overproduction of thyroid hormone.  Temporary breathing  difficulties. This is a very rare complication and usually goes away within weeks.  What happens before the procedure?  Your health care provider will perform a physical exam and assess your voice for vocal changes.  Ask your health care provider about: ? Changing or stopping your regular medicines. This is especially important if you are taking diabetes medicines or blood thinners. ? Taking medicines such as aspirin and ibuprofen. These medicines can thin your blood. Do not take these medicines before your procedure if your health care provider instructs you not to.  Follow instructions from your health care provider about eating or drinking restrictions. What happens during the procedure? You will be given a medicine that makes you go to sleep (general anesthetic). The steps of each type of thyroid lobectomy are listed below: Conventional Thyroid Lobectomy  The surgeon will make an incision just above where your collarbone meets your breastbone.  The muscles in your neck will be separated to access your thyroid gland.  The damaged or diseased part of the gland will be identified and removed.  Your surgeon will find and preserve the nerves of your voice box (larynx) and protect the four parathyroid glands that are located close to the thyroid gland.  The removed part of your thyroid gland will be examined under a microscope. ? If no cancer is found, the incision will be closed with stitches (sutures). ? If cancer is found, it may be necessary to remove your thyroid gland entirely (total thyroidectomy). Some or all of the lymph nodes in your neck may also need to be removed.  You may need a tube (catheter) at  the incision site to drain blood and fluids that accumulate under your skin after the procedure. This may have to stay in place for a day or two after the procedure.  The incision will be closed with stitches (sutures). Endoscopic Thyroid Lobectomy  The surgeon will make several  small incisions in your neck, chest, or armpit.  The surgeon will insert a narrow tube with a light and a camera on the end (endoscope) into an incision.  Surgical instruments will be inserted into other incisions to remove part or all of your thyroid gland.  You may need a tube (catheter) at the incision site to drain blood and fluids that accumulate under your skin after the procedure.This may have to stay in place for a day or two after the procedure.  The incision will be closed with stitches (sutures). What happens after the procedure?  You will be taken to a recovery room. Your blood pressure, heart rate, breathing rate, and blood oxygen level will be monitored often until the medicines you were given have worn off.  You might feel some pain in your incision area or areas.  Your throat may be sore.  You may have a blood test to check the level of calcium in your body.  If you had a catheter put in during the procedure, it will usually be removed the next day.  You may be able to start drinking liquids at first. If this does not cause any problems, you will be able to slowly start eating what you usually eat. This information is not intended to replace advice given to you by your health care provider. Make sure you discuss any questions you have with your health care provider. Document Released: 12/22/2007 Document Revised: 08/04/2016 Document Reviewed: 05/04/2014 Elsevier Interactive Patient Education  2018 [employer].   Thyroid Nodule A thyroid nodule is an isolatedgrowth of thyroid cells that forms a lump in your thyroid gland. The thyroid gland is a butterfly-shaped gland. It is found in the lower front of your neck. This gland sends chemical messengers (hormones) through your blood to all parts of your body. These hormones are important in regulating your body temperature and helping your body to use energy. Thyroid nodules are common. Most are not cancerous (are benign).  You may have one nodule or several nodules. Different types of thyroid nodules include:  Nodules that grow and fill with fluid (thyroid cysts).  Nodules that produce too much thyroid hormone (hot nodules or hyperthyroid).  Nodules that produce no thyroid hormone (cold nodules or hypothyroid).  Nodules that form from cancer cells (thyroid cancers).  What are the causes? Usually, the cause of this condition is not known. What increases the risk? Factors that make this condition more likely to develop include:  Increasing age. Thyroid nodules become more common in people who are older than 47 years of age.  Gender. ? Benign thyroid nodules are more common in women. ? Cancerous (malignant) thyroid nodules are more common in men.  A family history that includes: ? Thyroid nodules. ? Pheochromocytoma. ? Thyroid carcinoma. ? Hyperparathyroidism.  Certain kinds of thyroid diseases, such as Hashimoto thyroiditis.  Lack of iodine.  A history of head and neck radiation, such as from X-rays.  What are the signs or symptoms? It is common for this condition to cause no symptoms. If you have symptoms, they may include:  A lump in your lower neck.  Feeling a lump or tickle in your throat.  Pain in your  neck, jaw, or ear.  Having trouble swallowing.  Hot nodules may cause symptoms that include:  Weight loss.  Warm, flushed skin.  Feeling hot.  Feeling nervous.  A racing heartbeat.  Cold nodules may cause symptoms that include:  Weight gain.  Dry skin.  Brittle hair. This may also occur with hair loss.  Feeling cold.  Fatigue.  Thyroid cancer nodules may cause symptoms that include:  Hard nodules that feel stuck to the thyroid gland.  Hoarseness.  Lumps in the glands near your thyroid (lymph nodes).  How is this diagnosed? A thyroid nodule may be felt by your health care provider during a physical exam. This condition may also be diagnosed based on your  symptoms. You may also have tests, including:  An ultrasound. This may be done to confirm the diagnosis.  A biopsy. This involves taking a sample from the nodule and looking at it under a microscope to see if the nodule is benign.  Blood tests to make sure that your thyroid is working properly.  Imaging tests such as MRI or CT scan may be done if: ? Your nodule is large. ? Your nodule is blocking your airway. ? Cancer is suspected.  How is this treated? Treatment depends on the cause and size of your nodule or nodules. If the nodule is benign, treatment may not be necessary. Your health care provider may monitor the nodule to see if it goes away without treatment. If the nodule continues to grow, is cancerous, or does not go away:  It may need to be drained with a needle.  It may need to be removed with surgery.  If you have surgery, part or all of your thyroid gland may need to be removed as well. Follow these instructions at home:  Pay attention to any changes in your nodule.  Take over-the-counter and prescription medicines only as told by your health care provider.  Keep all follow-up visits as told by your health care provider. This is important. Contact a health care provider if:  Your voice changes.  You have trouble swallowing.  You have pain in your neck, ear, or jaw that is getting worse.  Your nodule gets bigger.  Your nodule starts to make it harder for you to breathe. Get help right away if:  You have a sudden fever.  You feel very weak.  Your muscles look like they are shrinking (muscle wasting).  You have mood swings.  You feel very restless.  You feel confused.  You are seeing or hearing things that other people do not see or hear (having hallucinations).  You feel suddenly nauseous or throw up.  You suddenly have diarrhea.  You have chest pain.  There is a loss of consciousness. This information is not intended to replace advice given to  you by your health care provider. Make sure you discuss any questions you have with your health care provider. Document Released: 10/25/2004 Document Revised: 08/04/2016 Document Reviewed: 03/15/2015 Elsevier Interactive Patient Education  2018 [employer].

## 2018-10-19 NOTE — Progress Notes (Signed)
Patient will have the following labs drawn through [facility]: TSH, FT4, and thyroid peroxidase antibodies.  The patient will be placed in the recalls for 6 months.

## 2018-10-20 LAB — THYROID PEROXIDASE ANTIBODY: Thyroperoxidase Ab SerPl-aCnc: 47 IU/mL — ABNORMAL HIGH (ref 0–34)

## 2018-10-20 LAB — TSH+FREE T4
Free T4: 1.09 ng/dL (ref 0.82–1.77)
TSH: 0.703 u[IU]/mL (ref 0.450–4.500)

## 2018-10-21 NOTE — Progress Notes (Signed)
Patient ID: Vanessa Callahan, female   DOB: 1970/12/21, 47 y.o.   MRN: [id number]  Chief Complaint  Patient presents with  . New Patient (Initial Visit)    Enlarged thyroid gland  U/S DONE 04-06-18 @ [facility]    HPI Vanessa Callahan is a 47 y.o. female.   She reports having had discomfort and soreness in the region of her right thyroid lobe since about 2013. More recently, she has felt like it has become more difficult and uncomfortable to swallow.  She feels a shooting sensation from the lobe up toward her right ear.  She denies voice changes, but reports increased hair loss, dry skin and fingernails.  Weight s/p gastric bypass is stable.  She thinks the increased frequency of her migraine headaches may be related to her thyroid.  She endorses poor energy and cold intolerance, with some Raynaud's-like symptoms.  No history of head/neck irradiation or occupational exposure to ionizing radiation.   Past Medical History:  Diagnosis Date  . Insomnia   . Vitamin D deficiency     Past Surgical History:  Procedure Laterality Date  . ABDOMINAL HYSTERECTOMY    . APPENDECTOMY    . BREAST REDUCTION SURGERY    . GALLBLADDER SURGERY    . GASTRIC BYPASS      Family History  Problem Relation Age of Onset  . Cancer Mother   . Diabetes Father   . Hypertension Father   . Hyperlipidemia Father     Social History Social History   Tobacco Use  . Smoking status: Never Smoker  . Smokeless tobacco: Never Used  Substance Use Topics  . Alcohol use: No  . Drug use: Not on file    Allergies  Allergen Reactions  . Sulfur Rash    Current Outpatient Medications  Medication Sig Dispense Refill  . butalbital-acetaminophen-caffeine (FIORICET, ESGIC) 50-325-40 MG tablet Take 1-2 tablets by mouth every 8 (eight) hours as needed for migraine. 15 tablet 0  . cyclobenzaprine (FLEXERIL) 10 MG tablet Take 0.5-1 tablets (5-10 mg total) by mouth 3 (three) times daily as needed for muscle spasms. 10 tablet 0  .  fluticasone (FLONASE) 50 MCG/ACT nasal spray Place 2 sprays into both nostrils daily. 16 g 1  . SUMAtriptan 6 MG/0.5ML SOAJ INJECT 0.5ML INTO THE SKIN FOR 1 DOSE.  1   No current facility-administered medications for this visit.     Review of Systems Review of Systems  All other systems reviewed and are negative.   Blood pressure (!) 152/96, pulse 62, temperature 97.7 F (36.5 C), temperature source Temporal, height 5' 5.5" (1.664 m), weight 157 lb 12.8 oz (71.6 kg), SpO2 96 %.  Physical Exam Physical Exam  Constitutional: She is oriented to person, place, and time. She appears well-developed and well-nourished. No distress.  HENT:  Head: Normocephalic and atraumatic.  Mouth/Throat: Oropharynx is clear and moist. No oropharyngeal exudate.  Eyes: Pupils are equal, round, and reactive to light. No scleral icterus.  No proptosis or exophthalmos.  Neck: Normal range of motion. Neck supple. No tracheal deviation present. Thyromegaly present.  Visible enlargement of the right lobe of the thyroid. Gland moves freely with deglutition.  Cardiovascular: Normal rate, regular rhythm and intact distal pulses.  Pulmonary/Chest: Effort normal and breath sounds normal.  Abdominal: Soft. Bowel sounds are normal.  Musculoskeletal: She exhibits no edema or deformity.  Lymphadenopathy:    She has no cervical adenopathy.  Neurological: She is alert and oriented to person, place, and time.  Skin:  Skin is warm and dry. No rash noted.  Psychiatric: She has a normal mood and affect.    Data Reviewed CLINICAL DATA:  Palpable abnormality.  Enlarged thyroid gland.  EXAM: THYROID ULTRASOUND  TECHNIQUE: Ultrasound examination of the thyroid gland and adjacent soft tissues was performed.  COMPARISON:  None.  FINDINGS: Parenchymal Echotexture: Normal  Isthmus: 0.2 cm  Right lobe: 5.7 x 2.4 x 2.8 cm  Left lobe: 4.9 x 1.4 x 2.0  cm  _________________________________________________________  Estimated total number of nodules >/= 1 cm: 1  Number of spongiform nodules >/=  2 cm not described below (TR1): 0  Number of mixed cystic and solid nodules >/= 1.5 cm not described below (TR2): 0  _________________________________________________________  Nodule # 1:  Location: Right; Mid  Maximum size: 2.9 cm; Other 2 dimensions: 2.2 x 1.9 cm  Composition: mixed cystic and solid (1)  Echogenicity: isoechoic (1)  Shape: not taller-than-wide (0)  Margins: smooth (0)  Echogenic foci: none (0)  ACR TI-RADS total points: 2.  ACR TI-RADS risk category: TR2 (2 points).  ACR TI-RADS recommendations: This nodule does NOT meet TI-RADS criteria for biopsy or dedicated follow-up.  _________________________________________________________  IMPRESSION: Right nodule 1 measures 2.9 cm and does not meet criteria for biopsy nor follow-up.  The above is in keeping with the ACR TI-RADS recommendations - J Am Coll Radiol 2017;14:587-595.   Electronically Signed   By: Jolaine Click M.D.   On: 04/06/2018 14:19  Results for PEYTAN, ANDRINGA (MRN [id number]) as of 10/21/2018 10:03  Ref. Range 05/20/2016 10:55 03/02/2018 09:55  TSH Latest Units: mIU/L 1.020 1.07    Assessment    47 y/o F with a symptomatic right thyroid nodule. 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      Plan    Agree with radiology that the nodule does not meet criteria for biopsy, however the local symptoms from the mass are troublesome for the patient. She is interested in surgery.  I have offered her a thyroid lobectomy.  Risks of the operation were discussed, including, but not limited to: bleeding, infection, damage to surrounding structures (including but not limited to the parathyroid glands, RLN and external  branch of the SLN), need for thyroid hormone replacement therapy, need for additional surgery. In addition, we will check labs for TSH, FT4, and TPOAb.  She needs to look at her family and work schedule to determine the best date. She will contact us and we will get her scheduled at that time.       Duanne Guess 10/21/2018, 9:55 AM

## 2018-10-22 ENCOUNTER — Encounter: Payer: Self-pay | Admitting: General Surgery

## 2018-10-22 ENCOUNTER — Other Ambulatory Visit: Payer: Self-pay | Admitting: General Surgery

## 2018-10-22 DIAGNOSIS — E041 Nontoxic single thyroid nodule: Secondary | ICD-10-CM

## 2018-10-26 ENCOUNTER — Encounter: Payer: Self-pay | Admitting: Family Medicine

## 2018-10-26 ENCOUNTER — Ambulatory Visit: Payer: BLUE CROSS/BLUE SHIELD | Admitting: Family Medicine

## 2018-10-26 VITALS — BP 132/84 | HR 86 | Temp 98.5°F | Resp 16 | Ht 66.0 in | Wt 155.6 lb

## 2018-10-26 DIAGNOSIS — R05 Cough: Secondary | ICD-10-CM

## 2018-10-26 DIAGNOSIS — R059 Cough, unspecified: Secondary | ICD-10-CM

## 2018-10-26 DIAGNOSIS — J0140 Acute pansinusitis, unspecified: Secondary | ICD-10-CM

## 2018-10-26 MED ORDER — BENZONATATE 100 MG PO CAPS: 100.0000 mg | ORAL_CAPSULE | Freq: Three times a day (TID) | ORAL | 0 refills | Status: DC | PRN

## 2018-10-26 MED ORDER — AMOXICILLIN-POT CLAVULANATE 875-125 MG PO TABS: 1.0000 | ORAL_TABLET | Freq: Two times a day (BID) | ORAL | 0 refills | Status: AC

## 2018-10-26 NOTE — Progress Notes (Signed)
Name: Vanessa Callahan   MRN: [id number]    DOB: 05-09-1971   Date:10/26/2018       Progress Note  Subjective  Chief Complaint  Chief Complaint  Patient presents with  . Sinusitis    for 2 weeks    HPI  Pt presents with concern for sinus pain and pressure - worse in the maxillary sinuses; ongoing headache that she cannot get rid of that is in the front of her head - not a migraine. She has been taking Claritin D, tried pseudophed PE, netti pot, humidifier, flonase. She denies vision changes or pain with ocular movement.  Has thick nasal drainage, cough due to PND and sore/swollen lymph nodes in her neck.  Denies chest pain, shortness of breath, fevers/chills, no vision changes.  Patient Active Problem List   Diagnosis Date Noted  . Acute non-recurrent maxillary sinusitis 02/07/2017  . Migraines 05/21/2016  . Vitamin D deficiency 05/21/2016  . Allergic rhinitis 05/21/2016  . Overweight (BMI 25.0-29.9) 05/20/2016  . Hx of gastric bypass 05/20/2016    Social History   Tobacco Use  . Smoking status: Never Smoker  . Smokeless tobacco: Never Used  Substance Use Topics  . Alcohol use: No     Current Outpatient Medications:  .  butalbital-acetaminophen-caffeine (FIORICET, ESGIC) 50-325-40 MG tablet, Take 1-2 tablets by mouth every 8 (eight) hours as needed for migraine., Disp: 15 tablet, Rfl: 0 .  cyclobenzaprine (FLEXERIL) 10 MG tablet, Take 0.5-1 tablets (5-10 mg total) by mouth 3 (three) times daily as needed for muscle spasms., Disp: 10 tablet, Rfl: 0 .  fluticasone (FLONASE) 50 MCG/ACT nasal spray, Place 2 sprays into both nostrils daily., Disp: 16 g, Rfl: 1 .  SUMAtriptan 6 MG/0.5ML SOAJ, INJECT 0.5ML INTO THE SKIN FOR 1 DOSE., Disp: , Rfl: 1  Allergies  Allergen Reactions  . Sulfur Rash    I personally reviewed active problem list, medication list, notes from last encounter with the patient/caregiver today.  ROS  Ten systems reviewed and is negative except as  mentioned in HPI  Objective  Vitals:   10/26/18 1054  BP: 132/84  Pulse: 86  Resp: 16  Temp: 98.5 F (36.9 C)  TempSrc: Oral  SpO2: 99%  Weight: 155 lb 9.6 oz (70.6 kg)  Height: 5\' 6"  (1.676 m)   Body mass index is 25.11 kg/m.  Nursing Note and Vital Signs reviewed.  Physical Exam  Constitutional: Patient appears well-developed and well-nourished. No distress.  HEENT: head atraumatic, normocephalic, pupils equal and reactive to light, Bilateral TM's without erythema or effusion,  bilateral maxillary and frontal sinuses are tender - worse in the maxillary sinuses, neck supple with bilateral lymphadenopathy, throat within normal limits - no erythema or exudate, no tonsillar swelling Cardiovascular: Normal rate, regular rhythm and normal heart sounds.  No murmur heard. No BLE edema. Pulmonary/Chest: Effort normal and breath sounds clear bilaterally. No respiratory distress. Psychiatric: Patient has a normal mood and affect. behavior is normal. Judgment and thought content normal.  No results found for this or any previous visit (from the past 72 hour(s)).  Assessment & Plan  1. Acute non-recurrent pansinusitis - amoxicillin-clavulanate (AUGMENTIN) 875-125 MG tablet; Take 1 tablet by mouth 2 (two) times daily for 10 days.  Dispense: 20 tablet; Refill: 0  2. Cough - benzonatate (TESSALON PERLES) 100 MG capsule; Take 1 capsule (100 mg total) by mouth 3 (three) times daily as needed.  Dispense: 20 capsule; Refill: 0  -Red flags and when to present  for emergency care or RTC including fever >101.59F, chest pain, shortness of breath, new/worsening/un-resolving symptoms, pain with ocular movement, ocular swelling reviewed with patient at time of visit. Follow up and care instructions discussed and provided in AVS.

## 2018-10-26 NOTE — Patient Instructions (Signed)

## 2018-12-17 ENCOUNTER — Encounter: Payer: Self-pay | Admitting: General Surgery

## 2018-12-17 ENCOUNTER — Other Ambulatory Visit: Payer: Self-pay

## 2018-12-17 ENCOUNTER — Ambulatory Visit (INDEPENDENT_AMBULATORY_CARE_PROVIDER_SITE_OTHER): Payer: BLUE CROSS/BLUE SHIELD | Admitting: General Surgery

## 2018-12-17 VITALS — BP 165/101 | HR 72 | Temp 97.2°F | Resp 16 | Ht 65.5 in | Wt 159.0 lb

## 2018-12-17 DIAGNOSIS — E041 Nontoxic single thyroid nodule: Secondary | ICD-10-CM

## 2018-12-17 MED ORDER — AMOXICILLIN-POT CLAVULANATE 875-125 MG PO TABS: 1.0000 | ORAL_TABLET | Freq: Two times a day (BID) | ORAL | 0 refills | Status: AC

## 2018-12-17 NOTE — Patient Instructions (Signed)
We have seen you in our office today to discuss removing your Thyroid. Please see information below in regards to procedure and aftercare.  Your Surgery has been scheduled for 12/25/2018 with Dr. Duanne Guess at [facility].    If you have any FMLA or Disability Paperwork for your employer, please bring your paperwork in prior to your surgery.     Thyroidectomy A thyroidectomy is a surgery that is done to remove all (total thyroidectomy) or part (subtotal thyroidectomy) of your thyroid gland. The thyroid is a butterfly-shaped gland that is located at the lower front of your neck. It produces a substance that helps to control certain body processes (thyroid hormone). The amount of thyroid gland tissue that is removed during your thyroidectomy depends on the reason you need the procedure. You may have a thyroidectomy to treat conditions including:  Thyroid nodules.  Thyroid cancer.  Benign thyroid tumors.  Goiter.  Overactive thyroid gland (hyperthyroidism).  There are different ways to do a thyroidectomy:  Conventional thyroidectomy (open thyroidectomy). This procedure is the most common. In this procedure, the thyroid gland is removed through one surgical cut (incision) in the neck.  Endoscopic thyroidectomy. This procedure is less invasive. In this procedure, there may be several smaller incisions in the neck, chest, or armpit. The surgeon uses a tiny camera and other assistive tools to remove the thyroid gland.  Tell a health care provider about:  Any allergies you have.  All medicines you are taking, including vitamins, herbs, eye drops, creams, and over-the-counter medicines.  Any problems you or family members have had with anesthetic medicines.  Any blood disorders you have.  Any surgeries you have had.  Any medical conditions you have. What are the risks? Generally, this is a safe procedure. However, problems can occur and include:  A decrease in parathyroid hormone  levels (hypoparathyroidism). Your parathyroid glands are located behind your thyroid gland, and they maintain the calcium level in your body. If these glands are damaged during surgery, your calcium level will drop. This will make your nerves irritable and cause muscle spasms.  An increase in thyroid hormone.  Damage to the nerves of your voice box (larynx).  Bleeding.  Infection at the site of the incision or incisions.  Temporary breathing difficulties. This is a very rare complication. It usually goes away within weeks.  What happens before the procedure?  Your health care provider will perform a physical exam and assess your voice for vocal changes.  Ask your health care provider about: ? Changing or stopping your regular medicines. This is especially important if you are taking diabetes medicines or blood thinners. ? Taking medicines such as aspirin and ibuprofen. These medicines can thin your blood. Do not take these medicines before your procedure if your health care provider instructs you not to.  Follow instructions from your health care provider about eating or drinking restrictions. What happens during the procedure? You will be given a medicine that makes you go to sleep (general anesthetic). Depending on which type of thyroidectomy you have, this is what may happen during the procedure: Conventional Thyroidectomy  The surgeon will make an incision in the center of your lower neck.  The muscles in your neck will be separated to reveal your thyroid gland.  Part or all of your thyroid gland will be removed.  You may need a tube (catheter) at the incision site to drain blood and fluids that accumulate under the skin after the procedure.The catheter may have to stay  in place for a day or two after the procedure.  The incision will be closed with stitches (sutures). Endoscopic Thyroidectomy  The surgeon will make several small incisions in your neck, chest, or  armpit.  The surgeon will use a narrow tube with a light and camera at the end (endoscope). The surgeon will insert the endoscope into an incision.  Part or all of your thyroid gland will be removed.  You may need a catheter at the incision site to drain blood and fluids that accumulate under the skin after the procedure.The catheter may have to stay in place for a day or two after the procedure.  The incision will be closed with sutures. What happens after the procedure?  Your blood pressure, heart rate, breathing rate, and blood oxygen level will be monitored often until the medicines you were given have worn off.  Depending on the type of thyroidectomy you had, you may have: ? A swollen neck. ? Some mild neck pain. ? A slightly sore throat. ? A weak voice.  You will not be able to eat or drink until your health care provider says it is okay.  You may have a blood test to check the level of calcium in your body.  If you had a catheter put in during the procedure, it will usually be removed the next day. This information is not intended to replace advice given to you by your health care provider. Make sure you discuss any questions you have with your health care provider. Document Released: 05/28/2001 Document Revised: 08/04/2016 Document Reviewed: 05/04/2014 Elsevier Interactive Patient Education  2018 [employer]   Thyroidectomy, Care After Refer to this sheet in the next few weeks. These instructions provide you with information about caring for yourself after your procedure. Your health care provider may also give you more specific instructions. Your treatment has been planned according to current medical practices, but problems sometimes occur. Call your health care provider if you have any problems or questions after your procedure. What can I expect after the procedure? After your procedure, it is typical to have:  Mild pain in the neck or upper body, especially when  swallowing.  A sore throat.  A weak voice.  Follow these instructions at home:  Take medicines only as directed by your health care provider.  If your entire thyroid gland was removed, you may need to take thyroid hormone medicine from now on.  Do not take medicines that contain aspirin and ibuprofen until your health care provider says that you can. These medicines can increase your risk of bleeding.  Some pain medicines cause constipation. Drink enough fluid to keep your urine clear or pale yellow. This can help to prevent constipation.  Start slowly with eating. You may need to have only liquids and soft foods for a few days or as directed by your health care provider.  Do not take baths, swim, or use a hot tub until your health care provider approves.  There are many different ways to close and cover an incision, including stitches (sutures), skin glue, and adhesive strips. Follow your health care provider's instructions for: ? Incision care. ? Bandage (dressing) changes and removal. ? Incision closure removal.  Resume your usual activities as directed by your health care provider.  For the first 10 days after the procedure or as instructed by your health care provider: ? Do not lift anything heavier than 20 lb (9.1 kg). ? Do not jog, swim, or do other  strenuous exercises. ? Do not play contact sports.  Keep all follow-up visits as directed by your health care provider. This is important. Contact a health care provider if:  The soreness in your throat gets worse.  You have increased pain at your incision or incisions.  You have increased bleeding from an incision.  Your incision becomes infected. Watch for: ? Swelling. ? Redness. ? Warmth. ? Pus.  You notice a bad smell coming from an incision or dressing.  You have a fever.  You feel lightheaded or faint.  You have numbness, tingling, or muscle spasms in your: ? Arms. ? Hands. ? Feet. ? Face.  You have  trouble swallowing. Get help right away if:  You develop a rash.  You have difficulty breathing.  You hear whistling noises coming from your chest.  You develop a cough that gets worse.  Your speech changes, or you have hoarseness that gets worse. This information is not intended to replace advice given to you by your health care provider. Make sure you discuss any questions you have with your health care provider. Document Released: 06/21/2005 Document Revised: 08/04/2016 Document Reviewed: 05/04/2014 Elsevier Interactive Patient Education  2018 Else[employer] Inc.      Thyroidectomy, Care After This sheet gives you information about how to care for yourself after your procedure. Your health care provider may also give you more specific instructions. If you have problems or questions, contact your health care provider. What can I expect after the procedure? After the procedure, it is common to have:  Mild pain in the neck or upper body, especially when swallowing.  A swollen neck.  A sore throat.  A weak or hoarse voice.  Slight tingling or numbness around your mouth, or in your fingers or toes. This may last for a day or two after surgery. This condition is caused by low levels of calcium. You may be given calcium supplements to treat it. Follow these instructions at home:  Medicines  Take over-the-counter and prescription medicines only as told by your health care provider.  Do not drive or use heavy machinery while taking prescription pain medicine.  Do not take medicines that contain aspirin and ibuprofen until your health care provider says that you can. These medicines can increase your risk of bleeding.  Take a thyroid hormone medicine as recommended by your health care provider. You will have to take this medicine for the rest of your life if your entire thyroid was removed. Eating and drinking  Start slowly with eating. You may need to have only liquids and soft  foods for a few days or as directed by your health care provider.  To prevent or treat constipation while you are taking prescription pain medicine, your health care provider may recommend that you: ? Drink enough fluid to keep your urine pale yellow. ? Take over-the-counter or prescription medicines. ? Eat foods that are high in fiber, such as fresh fruits and vegetables, whole grains, and beans. ? Limit foods that are high in fat and processed sugars, such as fried and sweet foods. Incision care  Follow instructions from your health care provider about how to take care of your incision. Make sure you: ? Wash your hands with soap and water before you change your bandage (dressing). If soap and water are not available, use hand sanitizer. ? Change your dressing as told by your health care provider. ? Leave stitches (sutures), skin glue, or adhesive strips in place. These skin closures may  need to stay in place for 2 weeks or longer. If adhesive strip edges start to loosen and curl up, you may trim the loose edges. Do not remove adhesive strips completely unless your health care provider tells you to do that.  Check your incision area every day for signs of infection. Check for: ? Redness, swelling, or pain. ? Fluid or blood. ? Warmth. ? Pus or a bad smell.  Do not take baths, swim, or use a hot tub until your health care provider approves. Activity  For the first 10 days after the procedure or as instructed by your health care provider: ? Do not lift anything that is heavier than 10 lb (4.5 kg). ? Do not jog, swim, or do other strenuous exercises. ? Do not play contact sports.  Avoid sitting for a long time without moving. Get up to take short walks every 1-2 hours. This is needed to improve blood flow and breathing. Ask for help if you feel weak or unsteady.  Return to your normal activities as told by your health care provider. Ask your health care provider what activities are safe  for you. General instructions  Do not use any products that contain nicotine or tobacco, such as cigarettes and e-cigarettes. These can delay healing after surgery. If you need help quitting, ask your health care provider.  Keep all follow-up visits as told by your health care provider. This is important. Your health care provider needs to monitor the calcium level in your blood to make sure that it does not become low. Contact a health care provider if you:  Have a fever.  Have more redness, swelling, or pain around your incision area.  Have fluid or blood coming from your incision area.  Notice that your incision area feels warm to the touch.  Have pus or a bad smell coming from your incision area.  Have trouble talking.  Have nausea or vomiting for more than 2 days. Get help right away if you:  Have trouble breathing.  Have trouble swallowing.  Develop a rash.  Develop a cough that gets worse.  Notice that your speech changes, or you have hoarseness that gets worse.  Develop numbness, tingling, or muscle spasms in the arms, hands, feet, or face. Summary  After the procedure, it is common to feel mild pain in the neck or upper body, especially when swallowing.  Take medicines as told by your health care provider. These include pain medicines and thyroid hormones, if required.  Follow instructions from your health care provider about how to take care of your incision. Watch for signs of infection.  Keep all follow-up visits as told by your health care provider. This is important. Your health care provider needs to monitor the calcium level in your blood to make sure that it does not become low.  Get help right away if you develop difficulty breathing, or numbness, tingling, or muscle spasms in the arms, hands, feet, or face. This information is not intended to replace advice given to you by your health care provider. Make sure you discuss any questions you have with your  health care provider. Document Released: 06/21/2005 Document Revised: 10/07/2017 Document Reviewed: 10/07/2017 Elsevier Interactive Patient Education  2019 [employer].

## 2018-12-17 NOTE — H&P (View-Only) (Signed)
Vanessa Callahan is here for an update of her H&P prior to surgery on December 25, 2018.  Since our last visit, she has continued to have sinus issues. She was placed on prednisone with little relief.  She is taking OTC Claritin D as well as Sudafed.  She also is concerned that there is "something like a popcorn kernel" on her left tonsil.  As far as her thyroid is concerned, she continues to endorse tenderness overlying the nodule.  No worsening dysphagia or voice changes.  Past Medical History:  Diagnosis Date  . Insomnia   . Vitamin D deficiency    Past Surgical History:  Procedure Laterality Date  . ABDOMINAL HYSTERECTOMY    . APPENDECTOMY    . BREAST REDUCTION SURGERY    . GALLBLADDER SURGERY    . GASTRIC BYPASS     Family History  Problem Relation Age of Onset  . Cancer Mother   . Diabetes Father   . Hypertension Father   . Hyperlipidemia Father    Social History   Tobacco Use  . Smoking status: Never Smoker  . Smokeless tobacco: Never Used  Substance Use Topics  . Alcohol use: No  . Drug use: Not on file   Current Meds  Medication Sig  . butalbital-acetaminophen-caffeine (FIORICET, ESGIC) 50-325-40 MG tablet Take 1-2 tablets by mouth every 8 (eight) hours as needed for migraine.  . loratadine-pseudoephedrine (CLARITIN-D 24-HOUR) 10-240 MG 24 hr tablet Take 1 tablet by mouth daily.  . pseudoephedrine (SUDAFED) 30 MG tablet Take 30 mg by mouth every 4 (four) hours as needed for congestion.  . SUMAtriptan (IMITREX) 6 MG/0.5ML SOLN injection Inject 6 mg into the skin every 2 (two) hours as needed for migraine or headache.    Allergies  Allergen Reactions  . Sulfur Rash   Vitals:   12/17/18 0912  BP: (!) 165/101  Pulse: 72  Resp: 16  Temp: (!) 97.2 F (36.2 C)  SpO2: 97%   Physical Exam  Constitutional: She is oriented to person, place, and time. She appears well-developed and well-nourished.  HENT:  Head: Normocephalic and atraumatic.  Mouth/Throat: Oropharynx is  clear and moist. No oropharyngeal exudate.  No tonsillar exudate or mass. There is mild erythema.  Eyes: Pupils are equal, round, and reactive to light. Right eye exhibits no discharge. Left eye exhibits no discharge. No scleral icterus.  Neck: Normal range of motion. No tracheal deviation present. Thyromegaly present.  Palpable nodule in right lobe of thyroid. Moves easily with deglutition.  Cardiovascular: Normal rate and regular rhythm.  Pulmonary/Chest: Effort normal and breath sounds normal.  Abdominal: Soft. Bowel sounds are normal.  Musculoskeletal: Normal range of motion.        General: No edema.  Lymphadenopathy:    She has no cervical adenopathy.  Neurological: She is alert and oriented to person, place, and time.  Skin: Skin is warm and dry.  Psychiatric: She has a normal mood and affect. Her behavior is normal.    Labs: no new labs  Imaging: no new imaging  A/P: 47 y/o F with symptomatic right thyroid nodule. Will proceed as scheduled for right thyroid lobectomy.  Recommended that she not take both Claritin D and Sudafed, as these may be contributing to her elevated BP in clinic today. Augmentin prescribed for sinus infection.  

## 2018-12-17 NOTE — Progress Notes (Signed)
Vanessa Callahan is here for an update of her H&P prior to surgery on December 25, 2018.  Since our last visit, she has continued to have sinus issues. She was placed on prednisone with little relief.  She is taking OTC Claritin D as well as Sudafed.  She also is concerned that there is "something like a popcorn kernel" on her left tonsil.  As far as her thyroid is concerned, she continues to endorse tenderness overlying the nodule.  No worsening dysphagia or voice changes.  Past Medical History:  Diagnosis Date  . Insomnia   . Vitamin D deficiency    Past Surgical History:  Procedure Laterality Date  . ABDOMINAL HYSTERECTOMY    . APPENDECTOMY    . BREAST REDUCTION SURGERY    . GALLBLADDER SURGERY    . GASTRIC BYPASS     Family History  Problem Relation Age of Onset  . Cancer Mother   . Diabetes Father   . Hypertension Father   . Hyperlipidemia Father    Social History   Tobacco Use  . Smoking status: Never Smoker  . Smokeless tobacco: Never Used  Substance Use Topics  . Alcohol use: No  . Drug use: Not on file   Current Meds  Medication Sig  . butalbital-acetaminophen-caffeine (FIORICET, ESGIC) 50-325-40 MG tablet Take 1-2 tablets by mouth every 8 (eight) hours as needed for migraine.  Marland Kitchen loratadine-pseudoephedrine (CLARITIN-D 24-HOUR) 10-240 MG 24 hr tablet Take 1 tablet by mouth daily.  . pseudoephedrine (SUDAFED) 30 MG tablet Take 30 mg by mouth every 4 (four) hours as needed for congestion.  . SUMAtriptan (IMITREX) 6 MG/0.5ML SOLN injection Inject 6 mg into the skin every 2 (two) hours as needed for migraine or headache.    Allergies  Allergen Reactions  . Sulfur Rash   Vitals:   12/17/18 0912  BP: (!) 165/101  Pulse: 72  Resp: 16  Temp: (!) 97.2 F (36.2 C)  SpO2: 97%   Physical Exam  Constitutional: She is oriented to person, place, and time. She appears well-developed and well-nourished.  HENT:  Head: Normocephalic and atraumatic.  Mouth/Throat: Oropharynx is  clear and moist. No oropharyngeal exudate.  No tonsillar exudate or mass. There is mild erythema.  Eyes: Pupils are equal, round, and reactive to light. Right eye exhibits no discharge. Left eye exhibits no discharge. No scleral icterus.  Neck: Normal range of motion. No tracheal deviation present. Thyromegaly present.  Palpable nodule in right lobe of thyroid. Moves easily with deglutition.  Cardiovascular: Normal rate and regular rhythm.  Pulmonary/Chest: Effort normal and breath sounds normal.  Abdominal: Soft. Bowel sounds are normal.  Musculoskeletal: Normal range of motion.        General: No edema.  Lymphadenopathy:    She has no cervical adenopathy.  Neurological: She is alert and oriented to person, place, and time.  Skin: Skin is warm and dry.  Psychiatric: She has a normal mood and affect. Her behavior is normal.    Labs: no new labs  Imaging: no new imaging  A/P: 48 y/o F with symptomatic right thyroid nodule. Will proceed as scheduled for right thyroid lobectomy.  Recommended that she not take both Claritin D and Sudafed, as these may be contributing to her elevated BP in clinic today. Augmentin prescribed for sinus infection.

## 2018-12-18 ENCOUNTER — Encounter
Admission: RE | Admit: 2018-12-18 | Discharge: 2018-12-18 | Disposition: A | Discharge status: Home / Self Care | Payer: BLUE CROSS/BLUE SHIELD | Source: Ambulatory Visit | Attending: General Surgery | Admitting: General Surgery

## 2018-12-18 ENCOUNTER — Other Ambulatory Visit: Payer: Self-pay

## 2018-12-18 HISTORY — DX: Headache: R51

## 2018-12-18 HISTORY — DX: Headache, unspecified: R51.9

## 2018-12-18 HISTORY — DX: Chronic sinusitis, unspecified: J32.9

## 2018-12-18 HISTORY — DX: Sleep apnea, unspecified: G47.30

## 2018-12-18 NOTE — Patient Instructions (Signed)
Your procedure is scheduled on: 12-25-18 Report to Same Day Surgery 2nd floor medical mall [facility](Medical Mall Entrance-take elevator on left to 2nd floor.  Check in with surgery information desk.) To find out your arrival time please call [phone] between 1PM - 3PM on 12-24-18  Remember: Instructions that are not followed completely may result in serious medical risk, up to and including death, or upon the discretion of your surgeon and anesthesiologist your surgery may need to be rescheduled.    _x___ 1. Do not eat food after midnight the night before your procedure. You may drink clear liquids up to 2 hours before you are scheduled to arrive at the hospital for your procedure.  Do not drink clear liquids within 2 hours of your scheduled arrival to the hospital.  Clear liquids include  --Water or Apple juice without pulp  --Clear carbohydrate beverage such as ClearFast or Gatorade  --Black Coffee or Clear Tea (No milk, no creamers, do not add anything to the coffee or Tea   ____Ensure clear carbohydrate drink on the way to the hospital for bariatric patients  ____Ensure clear carbohydrate drink 3 hours before surgery for Dr Rutherford NailByrnett's patients if physician instructed.   No gum chewing or hard candies.     __x__ 2. No Alcohol for 24 hours before or after surgery.   __x__3. No Smoking or e-cigarettes for 24 prior to surgery.  Do not use any chewable tobacco products for at least 6 hour prior to surgery   ____  4. Bring all medications with you on the day of surgery if instructed.    __x__ 5. Notify your doctor if there is any change in your medical condition     (cold, fever, infections).    x___6. On the morning of surgery brush your teeth with toothpaste and water.  You may rinse your mouth with mouth wash if you wish.  Do not swallow any toothpaste or mouthwash.   Do not wear jewelry, make-up, hairpins, clips or nail polish.  Do not wear lotions, powders, or perfumes. You may wear  deodorant.  Do not shave 48 hours prior to surgery. Men may shave face and neck.  Do not bring valuables to the hospital.    [facility] is not responsible for any belongings or valuables.               Contacts, dentures or bridgework may not be worn into surgery.  Leave your suitcase in the car. After surgery it may be brought to your room.  For patients admitted to the hospital, discharge time is determined by your  treatment team.  _  Patients discharged the day of surgery will not be allowed to drive home.  You will need someone to drive you home and stay with you the night of your procedure.    Please read over the following fact sheets that you were given:   [facility] Preparing for [facility]  ____ Take anti-hypertensive listed below, cardiac, seizure, asthma, anti-reflux and psychiatric medicines. These include:  1. NONE  2.  3.  4.  5.  6.  ____Fleets enema or Magnesium Citrate as directed.   ____ Use CHG Soap or sage wipes as directed on instruction sheet   ____ Use inhalers on the day of surgery and bring to hospital day of surgery  ____ Stop Metformin and Janumet 2 days prior to surgery.    ____ Take 1/2 of usual insulin dose the night before surgery and none on  the morning surgery.   ____ Follow recommendations from Cardiologist, Pulmonologist or PCP regarding stopping Aspirin, Coumadin, Plavix ,Eliquis, Effient, or Pradaxa, and Pletal.  X____Stop Anti-inflammatories such as Advil, Aleve, Ibuprofen, Motrin, Naproxen, Naprosyn, Goodies powders or aspirin products NOW-OK to take Tylenol   ____ Stop supplements until after surgery.     ____ Bring C-Pap to the hospital.

## 2018-12-25 ENCOUNTER — Encounter: Payer: Self-pay | Admitting: *Deleted

## 2018-12-25 ENCOUNTER — Other Ambulatory Visit: Payer: Self-pay

## 2018-12-25 ENCOUNTER — Encounter
Admission: RE | Disposition: A | Discharge status: Home / Self Care | Payer: Self-pay | Source: Home / Self Care | Attending: General Surgery

## 2018-12-25 ENCOUNTER — Observation Stay
Admission: RE | Admit: 2018-12-25 | Discharge: 2018-12-26 | Disposition: A | Discharge status: Home / Self Care | Payer: BLUE CROSS/BLUE SHIELD | Attending: General Surgery | Admitting: General Surgery

## 2018-12-25 ENCOUNTER — Ambulatory Visit: Payer: BLUE CROSS/BLUE SHIELD | Admitting: Certified Registered Nurse Anesthetist

## 2018-12-25 DIAGNOSIS — Z8249 Family history of ischemic heart disease and other diseases of the circulatory system: Secondary | ICD-10-CM | POA: Insufficient documentation

## 2018-12-25 DIAGNOSIS — Z9049 Acquired absence of other specified parts of digestive tract: Secondary | ICD-10-CM | POA: Insufficient documentation

## 2018-12-25 DIAGNOSIS — E890 Postprocedural hypothyroidism: Secondary | ICD-10-CM

## 2018-12-25 DIAGNOSIS — Z833 Family history of diabetes mellitus: Secondary | ICD-10-CM | POA: Diagnosis not present

## 2018-12-25 DIAGNOSIS — Z882 Allergy status to sulfonamides status: Secondary | ICD-10-CM | POA: Diagnosis not present

## 2018-12-25 DIAGNOSIS — G43909 Migraine, unspecified, not intractable, without status migrainosus: Secondary | ICD-10-CM | POA: Diagnosis not present

## 2018-12-25 DIAGNOSIS — E559 Vitamin D deficiency, unspecified: Secondary | ICD-10-CM | POA: Insufficient documentation

## 2018-12-25 DIAGNOSIS — Z9889 Other specified postprocedural states: Secondary | ICD-10-CM

## 2018-12-25 DIAGNOSIS — Z809 Family history of malignant neoplasm, unspecified: Secondary | ICD-10-CM | POA: Diagnosis not present

## 2018-12-25 DIAGNOSIS — E041 Nontoxic single thyroid nodule: Secondary | ICD-10-CM | POA: Insufficient documentation

## 2018-12-25 DIAGNOSIS — Z90710 Acquired absence of both cervix and uterus: Secondary | ICD-10-CM | POA: Insufficient documentation

## 2018-12-25 DIAGNOSIS — Z9884 Bariatric surgery status: Secondary | ICD-10-CM | POA: Insufficient documentation

## 2018-12-25 DIAGNOSIS — G4700 Insomnia, unspecified: Secondary | ICD-10-CM | POA: Insufficient documentation

## 2018-12-25 DIAGNOSIS — E063 Autoimmune thyroiditis: Secondary | ICD-10-CM | POA: Diagnosis not present

## 2018-12-25 HISTORY — PX: THYROID LOBECTOMY: SHX420

## 2018-12-25 SURGERY — LOBECTOMY, THYROID
Anesthesia: General | Laterality: Right

## 2018-12-25 MED ORDER — TRAMADOL HCL 50 MG PO TABS
50.0000 mg | ORAL_TABLET | Freq: Four times a day (QID) | ORAL | Status: DC | PRN
  Filled 2018-12-25: qty 1

## 2018-12-25 MED ORDER — SIMETHICONE 80 MG PO CHEW
40.0000 mg | CHEWABLE_TABLET | Freq: Four times a day (QID) | ORAL | Status: DC | PRN
  Filled 2018-12-25: qty 1

## 2018-12-25 MED ORDER — LORATADINE-PSEUDOEPHEDRINE ER 10-240 MG PO TB24: 1.0000 | ORAL_TABLET | Freq: Every day | ORAL | Status: DC

## 2018-12-25 MED ORDER — BUTALBITAL-APAP-CAFFEINE 50-325-40 MG PO TABS
1.0000 | ORAL_TABLET | Freq: Three times a day (TID) | ORAL | Status: DC | PRN
  Filled 2018-12-25: qty 2

## 2018-12-25 MED ORDER — FENTANYL CITRATE (PF) 100 MCG/2ML IJ SOLN
INTRAMUSCULAR | Status: AC
  Filled 2018-12-25: qty 2

## 2018-12-25 MED ORDER — GLYCOPYRROLATE 0.2 MG/ML IJ SOLN
INTRAMUSCULAR | Status: DC | PRN
  Administered 2018-12-25: 0.2 mg via INTRAVENOUS

## 2018-12-25 MED ORDER — EPHEDRINE SULFATE 50 MG/ML IJ SOLN
INTRAMUSCULAR | Status: DC | PRN
  Administered 2018-12-25 (×2): 5 mg via INTRAVENOUS

## 2018-12-25 MED ORDER — DEXAMETHASONE SODIUM PHOSPHATE 10 MG/ML IJ SOLN
INTRAMUSCULAR | Status: DC | PRN
  Administered 2018-12-25: 10 mg via INTRAVENOUS

## 2018-12-25 MED ORDER — SEVOFLURANE IN SOLN
RESPIRATORY_TRACT | Status: AC
  Filled 2018-12-25: qty 250

## 2018-12-25 MED ORDER — ACETAMINOPHEN 500 MG PO TABS
1000.0000 mg | ORAL_TABLET | Freq: Four times a day (QID) | ORAL | Status: DC
  Administered 2018-12-25 – 2018-12-26 (×2): 1000 mg via ORAL
  Filled 2018-12-25 (×3): qty 2

## 2018-12-25 MED ORDER — PROPOFOL 10 MG/ML IV BOLUS
INTRAVENOUS | Status: AC
  Filled 2018-12-25: qty 20

## 2018-12-25 MED ORDER — REMIFENTANIL HCL 1 MG IV SOLR
INTRAVENOUS | Status: AC
  Filled 2018-12-25: qty 1000

## 2018-12-25 MED ORDER — HYDROCODONE-ACETAMINOPHEN 5-325 MG PO TABS: 1.0000 | ORAL_TABLET | ORAL | Status: DC | PRN

## 2018-12-25 MED ORDER — MIDAZOLAM HCL 2 MG/2ML IJ SOLN
INTRAMUSCULAR | Status: DC | PRN
  Administered 2018-12-25: 2 mg via INTRAVENOUS

## 2018-12-25 MED ORDER — LORATADINE 10 MG PO TABS
10.0000 mg | ORAL_TABLET | Freq: Every day | ORAL | Status: DC
  Administered 2018-12-26: 10 mg via ORAL
  Filled 2018-12-25 (×2): qty 1

## 2018-12-25 MED ORDER — REMIFENTANIL HCL 1 MG IV SOLR
INTRAVENOUS | Status: DC | PRN
  Administered 2018-12-25: .1 ug/kg/min via INTRAVENOUS

## 2018-12-25 MED ORDER — IBUPROFEN 400 MG PO TABS
600.0000 mg | ORAL_TABLET | Freq: Four times a day (QID) | ORAL | Status: DC | PRN
  Filled 2018-12-25: qty 1

## 2018-12-25 MED ORDER — FAMOTIDINE 20 MG PO TABS
20.0000 mg | ORAL_TABLET | Freq: Once | ORAL | Status: AC
  Administered 2018-12-25: 20 mg via ORAL

## 2018-12-25 MED ORDER — FAMOTIDINE 20 MG PO TABS
ORAL_TABLET | ORAL | Status: AC
  Administered 2018-12-25: 20 mg via ORAL
  Filled 2018-12-25: qty 1

## 2018-12-25 MED ORDER — EVICEL 2 ML EX KIT
PACK | CUTANEOUS | Status: DC | PRN
  Administered 2018-12-25: 2 mL

## 2018-12-25 MED ORDER — SODIUM CHLORIDE 0.9 % IV SOLN
INTRAVENOUS | Status: DC | PRN
  Administered 2018-12-25: 50 ug/min via INTRAVENOUS

## 2018-12-25 MED ORDER — SUMATRIPTAN SUCCINATE 6 MG/0.5ML ~~LOC~~ SOLN
6.0000 mg | SUBCUTANEOUS | Status: DC | PRN
  Filled 2018-12-25: qty 0.5

## 2018-12-25 MED ORDER — ACETAMINOPHEN 500 MG PO TABS
ORAL_TABLET | ORAL | Status: AC
  Filled 2018-12-25: qty 2

## 2018-12-25 MED ORDER — MAGNESIUM HYDROXIDE 400 MG/5ML PO SUSP
30.0000 mL | Freq: Every day | ORAL | Status: DC | PRN
  Filled 2018-12-25: qty 30

## 2018-12-25 MED ORDER — CHLORHEXIDINE GLUCONATE CLOTH 2 % EX PADS: 6.0000 | MEDICATED_PAD | Freq: Once | CUTANEOUS | Status: DC

## 2018-12-25 MED ORDER — HYDROMORPHONE HCL 1 MG/ML IJ SOLN: 0.5000 mg | INTRAMUSCULAR | Status: DC | PRN

## 2018-12-25 MED ORDER — PROMETHAZINE HCL 25 MG/ML IJ SOLN
6.2500 mg | INTRAMUSCULAR | Status: DC | PRN
  Administered 2018-12-25: 12.5 mg via INTRAVENOUS

## 2018-12-25 MED ORDER — PHENYLEPHRINE HCL 10 MG/ML IJ SOLN
INTRAMUSCULAR | Status: DC | PRN
  Administered 2018-12-25: 100 ug via INTRAVENOUS

## 2018-12-25 MED ORDER — ACETAMINOPHEN 10 MG/ML IV SOLN
INTRAVENOUS | Status: DC | PRN
  Administered 2018-12-25: 1000 mg via INTRAVENOUS

## 2018-12-25 MED ORDER — LACTATED RINGERS IV SOLN
INTRAVENOUS | Status: DC
  Administered 2018-12-25: 1000 mL via INTRAVENOUS
  Administered 2018-12-25: 15:00:00 via INTRAVENOUS

## 2018-12-25 MED ORDER — HYDROMORPHONE HCL 1 MG/ML IJ SOLN
0.2500 mg | INTRAMUSCULAR | Status: DC | PRN
  Administered 2018-12-25 (×4): 0.25 mg via INTRAVENOUS

## 2018-12-25 MED ORDER — HYDROMORPHONE HCL 1 MG/ML IJ SOLN
INTRAMUSCULAR | Status: AC
  Filled 2018-12-25: qty 1

## 2018-12-25 MED ORDER — SODIUM CHLORIDE FLUSH 0.9 % IV SOLN
INTRAVENOUS | Status: AC
  Filled 2018-12-25: qty 10

## 2018-12-25 MED ORDER — ONDANSETRON HCL 4 MG/2ML IJ SOLN
INTRAMUSCULAR | Status: DC | PRN
  Administered 2018-12-25: 4 mg via INTRAVENOUS

## 2018-12-25 MED ORDER — LACTATED RINGERS IV SOLN
INTRAVENOUS | Status: DC
  Administered 2018-12-25: 50 mL/h via INTRAVENOUS

## 2018-12-25 MED ORDER — PSEUDOEPHEDRINE HCL ER 120 MG PO TB12
240.0000 mg | ORAL_TABLET | Freq: Every day | ORAL | Status: DC
  Administered 2018-12-26: 240 mg via ORAL
  Filled 2018-12-25 (×2): qty 2

## 2018-12-25 MED ORDER — LACTATED RINGERS IV SOLN
INTRAVENOUS | Status: DC | PRN
  Administered 2018-12-25: 08:00:00 via INTRAVENOUS

## 2018-12-25 MED ORDER — ONDANSETRON HCL 4 MG/2ML IJ SOLN: 4.0000 mg | Freq: Four times a day (QID) | INTRAMUSCULAR | Status: DC | PRN

## 2018-12-25 MED ORDER — ONDANSETRON 4 MG PO TBDP: 4.0000 mg | ORAL_TABLET | Freq: Four times a day (QID) | ORAL | Status: DC | PRN

## 2018-12-25 MED ORDER — SUCCINYLCHOLINE CHLORIDE 20 MG/ML IJ SOLN
INTRAMUSCULAR | Status: DC | PRN
  Administered 2018-12-25: 100 mg via INTRAVENOUS

## 2018-12-25 MED ORDER — MIDAZOLAM HCL 2 MG/2ML IJ SOLN
INTRAMUSCULAR | Status: AC
  Filled 2018-12-25: qty 2

## 2018-12-25 MED ORDER — FENTANYL CITRATE (PF) 100 MCG/2ML IJ SOLN
INTRAMUSCULAR | Status: DC | PRN
  Administered 2018-12-25: 100 ug via INTRAVENOUS
  Administered 2018-12-25: 50 ug via INTRAVENOUS

## 2018-12-25 MED ORDER — PROMETHAZINE HCL 25 MG/ML IJ SOLN
INTRAMUSCULAR | Status: AC
  Filled 2018-12-25: qty 1

## 2018-12-25 MED ORDER — PROPOFOL 10 MG/ML IV BOLUS
INTRAVENOUS | Status: DC | PRN
  Administered 2018-12-25: 130 mg via INTRAVENOUS

## 2018-12-25 SURGICAL SUPPLY — 38 items
ADH SKN CLS APL DERMABOND .7 (GAUZE/BANDAGES/DRESSINGS) ×1
BLADE SURG 15 STRL LF DISP TIS (BLADE) ×1 IMPLANT
BLADE SURG 15 STRL SS (BLADE) ×2
CANISTER SUCT 1200ML W/VALVE (MISCELLANEOUS) ×2 IMPLANT
CLIP VESOCCLUDE SM WIDE 6/CT (CLIP) IMPLANT
COVER WAND RF STERILE (DRAPES) ×2 IMPLANT
DERMABOND ADVANCED (GAUZE/BANDAGES/DRESSINGS) ×1
DERMABOND ADVANCED .7 DNX12 (GAUZE/BANDAGES/DRESSINGS) ×1 IMPLANT
DRAPE MAG INST 16X20 L/F (DRAPES) ×2 IMPLANT
ELECT CAUTERY BLADE TIP 2.5 (TIP) ×2
ELECT REM PT RETURN 9FT ADLT (ELECTROSURGICAL) ×2
ELECTRODE CAUTERY BLDE TIP 2.5 (TIP) ×1 IMPLANT
ELECTRODE REM PT RTRN 9FT ADLT (ELECTROSURGICAL) ×1 IMPLANT
EVICEL AIRLESS SPRAY ACCES (MISCELLANEOUS) IMPLANT
GAUZE 4X4 16PLY RFD (DISPOSABLE) IMPLANT
GLOVE BIO SURGEON STRL SZ 6.5 (GLOVE) ×2 IMPLANT
GLOVE INDICATOR 7.0 STRL GRN (GLOVE) ×2 IMPLANT
GOWN STRL REUS W/ TWL LRG LVL3 (GOWN DISPOSABLE) ×2 IMPLANT
GOWN STRL REUS W/TWL LRG LVL3 (GOWN DISPOSABLE) ×4
KIT TURNOVER KIT A (KITS) ×2 IMPLANT
LABEL OR SOLS (LABEL) ×2 IMPLANT
MARKER SKIN DUAL TIP RULER LAB (MISCELLANEOUS) ×2 IMPLANT
NS IRRIG 500ML POUR BTL (IV SOLUTION) ×2 IMPLANT
PACK HEAD/NECK (MISCELLANEOUS) ×2 IMPLANT
RETRACTOR STAY HOOK 5MM (MISCELLANEOUS) IMPLANT
SHEARS HARMONIC 9CM CVD (BLADE) IMPLANT
SPONGE KITTNER 5P (MISCELLANEOUS) ×2 IMPLANT
STRIP CLOSURE SKIN 1/2X4 (GAUZE/BANDAGES/DRESSINGS) ×2 IMPLANT
SURGICEL SNOW 2X4 (HEMOSTASIS) ×2 IMPLANT
SUT MNCRL AB 4-0 PS2 18 (SUTURE) ×2 IMPLANT
SUT PROLENE 4 0 PS 2 18 (SUTURE) ×2 IMPLANT
SUT SILK 2 0 (SUTURE) ×4
SUT SILK 2-0 18XBRD TIE 12 (SUTURE) ×1 IMPLANT
SUT SILK 2-0 30XBRD TIE 12 (SUTURE) ×1 IMPLANT
SUT SILK 3 0 (SUTURE)
SUT SILK 3-0 18XBRD TIE 12 (SUTURE) IMPLANT
SUT VIC AB 4-0 SH 27 (SUTURE) ×2
SUT VIC AB 4-0 SH 27XANBCTRL (SUTURE) ×1 IMPLANT

## 2018-12-25 NOTE — Transfer of Care (Signed)
Immediate Anesthesia Transfer of Care Note  Patient: Vanessa LocksKerri D Grunow  Procedure(s) Performed: THYROID LOBECTOMY WITH INTRAOP NERVE MONITORING (Right )  Patient Location: PACU  Anesthesia Type:General  Level of Consciousness: awake, alert , oriented and patient cooperative  Airway & Oxygen Therapy: Patient Spontanous Breathing and Patient connected to nasal cannula oxygen  Post-op Assessment: Report given to RN and Post -op Vital signs reviewed and stable  Post vital signs: stable  Last Vitals:  Vitals Value Taken Time  BP 135/84 12/25/2018  9:57 AM  Temp    Pulse 103 12/25/2018  9:58 AM  Resp 20 12/25/2018  9:58 AM  SpO2 100 % 12/25/2018  9:58 AM  Vitals shown include unvalidated device data.  Last Pain:  Vitals:   12/25/18 16100632  TempSrc: Temporal  PainSc: 2       Patients Stated Pain Goal: 1 (12/25/18 [id number])  Complications: No apparent anesthesia complications

## 2018-12-25 NOTE — Anesthesia Procedure Notes (Signed)
Procedure Name: Intubation Date/Time: 12/25/2018 7:42 AM Performed by: Willette Alma, CRNA Pre-anesthesia Checklist: Patient identified, Patient being monitored, Timeout performed, Emergency Drugs available and Suction available Patient Re-evaluated:Patient Re-evaluated prior to induction Oxygen Delivery Method: Circle system utilized Preoxygenation: Pre-oxygenation with 100% oxygen Induction Type: IV induction Ventilation: Mask ventilation without difficulty Laryngoscope Size: Mac, McGraph and 3 Grade View: Grade I Tube type: Oral Tube size: 7.0 mm Number of attempts: 1 Airway Equipment and Method: Stylet and Video-laryngoscopy Placement Confirmation: ETT inserted through vocal cords under direct vision,  positive ETCO2 and breath sounds checked- equal and bilateral Secured at: 20 cm Tube secured with: Tape Dental Injury: Teeth and Oropharynx as per pre-operative assessment  Comments: Atraumatic DL x 1 attempt. Video laryngoscope used due to surgeon preference. LNM in appropriate position and confirmed by surgeon.

## 2018-12-25 NOTE — Anesthesia Post-op Follow-up Note (Signed)
Anesthesia QCDR form completed.        

## 2018-12-25 NOTE — Op Note (Signed)
Operative Note  Preoperative Diagnosis:  a thyroid nodule  Postoperative Diagnosis:  a thyroid nodule  Operation:  Right Thyroid Lobectomy and Isthmusectomy  Surgeon: Duanne GuessJennifer Riniyah Speich, MD  Assistant: Scherrie GerlachJason E. 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  Both parathyroid glands were identified and preserved in situ.  The recurrent laryngeal nerve was clearly identified and visualized throughout its course in the neck.  The neuro monitor, unfortunately, did not function correctly during this case.  Indications: Symptomatic right thyroid nodule  Procedure In Detail: The patient was identified in the preoperative holding area and brought to the operating room where she was placed supine on the OR table.  Bony prominences were padded and bilateral sequential compression devices were placed on the lower extremities.  General endotracheal anesthesia was induced using the nerve monitoring system.  A visual laryngoscope was used to confirm correct tube placement.  The patient was then positioned appropriately for the operation.  She was sterilely prepped and draped in standard fashion.  A timeout was performed confirming her identity the procedure being performed her allergies all necessary equipment was available and that maintenance anesthetic was not used.  A 4 cm transverse incision was made midway between the sternal notch and thyroid cartilage.  This was carried down through the subcutaneous tissues and platysma using electrocautery.  Subplatysmal flaps were elevated and the strap muscles were divided in the median raphae.  We elevated the strap muscles off of the right lobe of the thyroid.  We sequentially isolated and divided the superior pole vessels with silk ties and the harmonic scalpel.  The superior parathyroid gland was  identified and preserved in situ.  We then divided the middle thyroid vein and rotated the thyroid medially.  We dissected into the lateral fibrofatty tissues of the central neck until we identified the recurrent laryngeal nerve.  It did not give a signal when stimulated, however it was dissected free from all surrounding tissues and was clearly completely structurally intact.  It was visualized from the superior mediastinum up to its insertion point at the cricothyroid muscle.  The inferior parathyroid gland was identified and the inferior pole vessels were divided.  We then dissected the thyroid off the trachea and divided the ligament of Berry and anterior suspensory ligament.  The thyroid tissue was divided at the left lateral aspect of the isthmus using the harmonic scalpel.  The raw surface of the thyroid was then cauterized to minimize any oozing.  We irrigated the wound bed and obtain good hemostasis.  A Valsalva maneuver from the anesthesia team confirmed no ongoing surgical bleeding.  We applied SNoW and Evicel for additional hemostatic effects.  The strap muscles were closed in the midline with running 4-0 Vicryl and the platysma was closed with interrupted Vicryl.  Skin was closed with running subcuticular Prolene.  The skin was cleaned, and Dermabond and Steri-Strips were applied.  The Prolene suture was removed.  The patient was awakened, extubated, and taken to the postanesthesia care unit in good condition.  EBL: <5cc  IVF:  See anesthesia record  Specimen(s): right thyroid lobe and isthmus for permanent  Complications: none immediately apparent.   Counts: all needles, instruments, and sponges were counted and reported to be correct in number at the end of the case.   I was present for and  participated in the entire operation.  Duanne Guess @TODAY @ 10:06 AM

## 2018-12-25 NOTE — Anesthesia Preprocedure Evaluation (Addendum)
Anesthesia Evaluation  Patient identified by MRN, date of birth, ID band Patient awake    Reviewed: Allergy & Precautions, H&P , NPO status , Patient's Chart, lab work & pertinent test results  Airway Mallampati: II       Dental  (+) Teeth Intact   Pulmonary sleep apnea , neg pneumonia , neg COPD, Recent URI  (nasal congestion and drainage, no cough),           Cardiovascular (-) anginanegative cardio ROS       Neuro/Psych  Headaches, negative psych ROS   GI/Hepatic negative GI ROS, Neg liver ROS,   Endo/Other  negative endocrine ROS  Renal/GU negative Renal ROS  negative genitourinary   Musculoskeletal negative musculoskeletal ROS (+)   Abdominal   Peds  Hematology negative hematology ROS (+)   Anesthesia Other Findings Past Medical History: No date: Headache     Comment:  migraines No date: Insomnia 12/17/2017: Sinus infection     Comment:  currently on augmentin No date: Sleep apnea     Comment:  h/o had gastric bypass and sleep apnea resolved No date: Vitamin D deficiency  Past Surgical History: No date: ABDOMINAL HYSTERECTOMY No date: APPENDECTOMY 1999: BREAST REDUCTION SURGERY 2018: GALLBLADDER SURGERY 2013: GASTRIC BYPASS 2014: HERNIA REPAIR     Comment:  umbilical  BMI    Body Mass Index:  26.05 kg/m      Reproductive/Obstetrics negative OB ROS                            Anesthesia Physical Anesthesia Plan  ASA: II  Anesthesia Plan: General ETT   Post-op Pain Management:    Induction:   PONV Risk Score and Plan: Ondansetron, Dexamethasone, Midazolam and Treatment may vary due to age or medical condition  Airway Management Planned:   Additional Equipment:   Intra-op Plan:   Post-operative Plan:   Informed Consent: I have reviewed the patients History and Physical, chart, labs and discussed the procedure including the risks, benefits and alternatives  for the proposed anesthesia with the patient or authorized representative who has indicated his/her understanding and acceptance.   Dental Advisory Given  Plan Discussed with: Anesthesiologist, CRNA and Surgeon  Anesthesia Plan Comments:         Anesthesia Quick Evaluation

## 2018-12-25 NOTE — Brief Op Note (Signed)
12/25/2018  10:04 AM  PATIENT:  Vanessa Callahan  48 y.o. female  PRE-OPERATIVE DIAGNOSIS:  thyroid nodule right  POST-OPERATIVE DIAGNOSIS:  thyroid nodule right  PROCEDURE:  Procedure(s): THYROID LOBECTOMY WITH INTRAOP NERVE MONITORING (Right)  SURGEON:  Surgeon(s) and Role:    Duanne Guess, MD - Primary  PHYSICIAN ASSISTANT:   ASSISTANTS: Scherrie Gerlach. Earlene Plater, MD   ANESTHESIA:   general  EBL:  <5 cc  BLOOD ADMINISTERED:none  DRAINS: none   LOCAL MEDICATIONS USED:  NONE  SPECIMEN:  Source of Specimen:  right thyroid lobe and isthmus  DISPOSITION OF SPECIMEN:  PATHOLOGY  COUNTS:  YES  TOURNIQUET:  * No tourniquets in log *  DICTATION: .Note written in EPIC  PLAN OF CARE: Admit for overnight observation  PATIENT DISPOSITION:  PACU - hemodynamically stable.   Delay start of Pharmacological VTE agent (>24hrs) due to surgical blood loss or risk of bleeding: not applicable

## 2018-12-25 NOTE — Interval H&P Note (Signed)
History and Physical Interval Note:  12/25/2018 7:23 AM  Vanessa Callahan  has presented today for surgery, with the diagnosis of thyroid nodule right  The various methods of treatment have been discussed with the patient and family. After consideration of risks, benefits and other options for treatment, the patient has consented to  Procedure(s): THYROID LOBECTOMY WITH INTRAOP NERVE MONITORING (Right) as a surgical intervention .  The patient's history has been reviewed, patient examined, no change in status, stable for surgery.  I have reviewed the patient's chart and labs.  Questions were answered to the patient's satisfaction.     Duanne Guess

## 2018-12-25 NOTE — Anesthesia Postprocedure Evaluation (Signed)
Anesthesia Post Note  Patient: Doristine LocksKerri D Molino  Procedure(s) Performed: THYROID LOBECTOMY WITH INTRAOP NERVE MONITORING (Right )  Patient location during evaluation: PACU Anesthesia Type: General Level of consciousness: awake and alert Pain management: pain level controlled Vital Signs Assessment: post-procedure vital signs reviewed and stable Respiratory status: spontaneous breathing, nonlabored ventilation, respiratory function stable and patient connected to nasal cannula oxygen Cardiovascular status: blood pressure returned to baseline and stable Postop Assessment: no apparent nausea or vomiting Anesthetic complications: no     Last Vitals:  Vitals:   12/25/18 1448 12/25/18 1510  BP: 125/78 133/80  Pulse: 75 72  Resp: 17 16  Temp: 36.4 C 36.5 C  SpO2: 100% 99%    Last Pain:  Vitals:   12/25/18 1510  TempSrc: Oral  PainSc:                  Jovita GammaKathryn L Fitzgerald

## 2018-12-26 DIAGNOSIS — E063 Autoimmune thyroiditis: Secondary | ICD-10-CM | POA: Diagnosis not present

## 2018-12-26 DIAGNOSIS — G43909 Migraine, unspecified, not intractable, without status migrainosus: Secondary | ICD-10-CM | POA: Diagnosis not present

## 2018-12-26 DIAGNOSIS — Z9049 Acquired absence of other specified parts of digestive tract: Secondary | ICD-10-CM | POA: Diagnosis not present

## 2018-12-26 DIAGNOSIS — Z882 Allergy status to sulfonamides status: Secondary | ICD-10-CM | POA: Diagnosis not present

## 2018-12-26 DIAGNOSIS — G4700 Insomnia, unspecified: Secondary | ICD-10-CM | POA: Diagnosis not present

## 2018-12-26 DIAGNOSIS — Z8249 Family history of ischemic heart disease and other diseases of the circulatory system: Secondary | ICD-10-CM | POA: Diagnosis not present

## 2018-12-26 DIAGNOSIS — Z833 Family history of diabetes mellitus: Secondary | ICD-10-CM | POA: Diagnosis not present

## 2018-12-26 DIAGNOSIS — Z90710 Acquired absence of both cervix and uterus: Secondary | ICD-10-CM | POA: Diagnosis not present

## 2018-12-26 DIAGNOSIS — Z9884 Bariatric surgery status: Secondary | ICD-10-CM | POA: Diagnosis not present

## 2018-12-26 DIAGNOSIS — E559 Vitamin D deficiency, unspecified: Secondary | ICD-10-CM | POA: Diagnosis not present

## 2018-12-26 DIAGNOSIS — E041 Nontoxic single thyroid nodule: Secondary | ICD-10-CM | POA: Diagnosis not present

## 2018-12-26 DIAGNOSIS — Z809 Family history of malignant neoplasm, unspecified: Secondary | ICD-10-CM | POA: Diagnosis not present

## 2018-12-26 LAB — HIV ANTIBODY (ROUTINE TESTING W REFLEX): HIV Screen 4th Generation wRfx: NONREACTIVE

## 2018-12-26 MED ORDER — HYDROCODONE-ACETAMINOPHEN 5-325 MG PO TABS: 1.0000 | ORAL_TABLET | ORAL | 0 refills | Status: DC | PRN

## 2018-12-26 NOTE — Discharge Summary (Signed)
  Patient ID: Vanessa Callahan MRN: [id number] DOB/AGE: 48/26/1972 48 y.o.  Admit date: 12/25/2018 Discharge date: 12/26/2018   Discharge Diagnoses:  Active Problems:   Thyroid nodule   S/P partial thyroidectomy   Procedures: Right hemithyroidectomy  Hospital Course: 48 yo female w Symptomatic thyroid lobule underwent an uneventful Thyroid lobectomy. She was kept overnight. At the time of DC she ws ambulating, normal voice, no evidence of complications. AVSS. PE reveled a female in NAD, alert. Neck Incision healing well, steri strips in place, no expanding hematoma. Her voice was nml. No evidence of CN injuries. Chest: NT, no resp distress. Abd: soft, nt, Ext: warm and well perfused. Condition at the time of DC was stable   Disposition: Home   Allergies as of 12/26/2018      Reactions   Sulfa Antibiotics Rash   Redness all over      Medication List    TAKE these medications   acetaminophen 500 MG tablet Commonly known as:  TYLENOL Take 500 mg by mouth every 6 (six) hours as needed.   amoxicillin-clavulanate 875-125 MG tablet Commonly known as:  AUGMENTIN Take 1 tablet by mouth 2 (two) times daily for 14 days.   butalbital-acetaminophen-caffeine 50-325-40 MG tablet Commonly known as:  FIORICET, ESGIC Take 1-2 tablets by mouth every 8 (eight) hours as needed for migraine.   HYDROcodone-acetaminophen 5-325 MG tablet Commonly known as:  NORCO/VICODIN Take 1 tablet by mouth every 4 (four) hours as needed for severe pain.   loratadine-pseudoephedrine 10-240 MG 24 hr tablet Commonly known as:  CLARITIN-D 24-hour Take 1 tablet by mouth daily.   pseudoephedrine 30 MG tablet Commonly known as:  SUDAFED Take 30 mg by mouth every 4 (four) hours as needed for congestion.   SUMAtriptan 6 MG/0.5ML Soln injection Commonly known as:  IMITREX Inject 6 mg into the skin every 2 (two) hours as needed for migraine or headache.      Follow-up Information    Duanne Guess, MD.  Schedule an appointment as soon as possible for a visit in 2 week(s).   Specialty:  General Surgery Why:  s/p thyroid lobectomy Contact information: [address] [place] [address] [phone]            Sterling Big, MD FACS

## 2018-12-26 NOTE — Discharge Instructions (Signed)
Open Thyroid Lobectomy, Care After  This sheet gives you information about how to care for yourself after your procedure. Your health care provider may also give you more specific instructions. If you have problems or questions, contact your health care provider.  What can I expect after the procedure?  After the procedure, it is common to have:  · Mild pain in the neck or upper body, especially when swallowing.  · A sore throat.  · A weak voice.  Follow these instructions at home:  Activity    · Do not lift anything that is heavier than 10 lb (4.5 kg), or the limit that you are told, until your health care provider says that it is safe.  · Do not jog, swim, or do other activities that take a lot of effort until your health care provider approves.  · Return to your normal activities as told by your health care provider. Ask your health care provider what activities are safe for you.  Medicines  · Take over-the-counter and prescription medicines only as told by your health care provider.  · Do not take medicines that contain aspirin and ibuprofen until your health care provider says that you can. These medicines can increase your risk of bleeding.  · Do not drive or use heavy machinery while taking prescription pain medicine.  Incision care  · Follow instructions from your health care provider about how to take care of your incision. Make sure you:  ? Wash your hands with soap and water before you change your bandage (dressing). If soap and water are not available, use hand sanitizer.  ? Change your dressing as told by your health care provider.  ? Leave stitches (sutures), skin glue, or adhesive strips in place. These skin closures may need to stay in place for 2 weeks or longer. If adhesive strip edges start to loosen and curl up, you may trim the loose edges. Do not remove adhesive strips completely unless your health care provider tells you to do that.  · Check your incision area every day for signs of infection.  Check for:  ? Redness, swelling, or pain.  ? Fluid or blood.  ? Warmth.  ? Pus or a bad smell.  · If you were sent home with a surgical drain tube in place, follow instructions from your health care provider about emptying it.  General instructions  · Follow instructions from your health care provider about eating or drinking restrictions. You may need to have only liquids and soft foods for a few days after the procedure.  · Do not take baths, swim, or use a hot tub until your health care provider approves. Ask your health care provider when you can take showers.  · If you are taking prescription pain medicine, take actions to prevent or treat constipation. Your health care provider may recommend that you:  ? Drink enough fluid to keep your urine pale yellow.  ? Eat foods that are high in fiber, such as fresh fruits and vegetables, whole grains, and beans.  § If you need to have only liquids, try options such as fruit smoothies.  § If you need to have soft foods, try options such as cooked fruits and vegetables and oatmeal.  ? Limit foods that are high in fat and processed sugars, such as fried or sweet foods.  ? Take an over-the-counter or prescription medicine for constipation.  · Keep all follow-up visits as told by your health care provider. This is   important.  Contact a health care provider if:  · The soreness in your throat gets worse.  · You have redness, swelling, or pain around your incision.  · You have fluid or blood coming from your incision.  · Your incision feels warm to the touch.  · You have pus or a bad smell coming from your incision area.  · You have a fever.  · You feel light-headed, or you faint.  · You have numbness, tingling, or muscle spasms in your arms, hands, feet, or face.  · You have trouble swallowing.  Get help right away if:  · You develop a rash.  · You have difficulty breathing.  · You hear whistling noises coming from your chest.  · You develop a cough that gets worse.  · Your  speech changes, or you have hoarseness that gets worse.  Summary  · After the procedure, it is common to have a sore throat, a weak voice, and mild pain in the neck or upper body.  · Follow instructions from your health care provider about eating or drinking. You may need to have only liquids and soft foods for a few days after the procedure.  · Take over-the-counter and prescription medicines only as told by your health care provider.  · Make sure you know the signs of a possible problem and when to contact your health care provider.  This information is not intended to replace advice given to you by your health care provider. Make sure you discuss any questions you have with your health care provider.  Document Released: 06/29/2014 Document Revised: 08/11/2017 Document Reviewed: 08/11/2017  Elsevier Interactive Patient Education © 2019 [employer]

## 2018-12-29 LAB — SURGICAL PATHOLOGY

## 2018-12-30 ENCOUNTER — Encounter: Payer: Self-pay | Admitting: General Surgery

## 2019-01-11 ENCOUNTER — Encounter: Payer: BLUE CROSS/BLUE SHIELD | Admitting: General Surgery

## 2019-01-11 ENCOUNTER — Ambulatory Visit (INDEPENDENT_AMBULATORY_CARE_PROVIDER_SITE_OTHER): Payer: BLUE CROSS/BLUE SHIELD | Admitting: General Surgery

## 2019-01-11 ENCOUNTER — Encounter: Payer: Self-pay | Admitting: General Surgery

## 2019-01-11 ENCOUNTER — Other Ambulatory Visit: Payer: Self-pay

## 2019-01-11 VITALS — BP 136/88 | HR 89 | Temp 97.9°F | Resp 14 | Ht 65.5 in | Wt 152.6 lb

## 2019-01-11 DIAGNOSIS — Z9889 Other specified postprocedural states: Secondary | ICD-10-CM

## 2019-01-11 DIAGNOSIS — E890 Postprocedural hypothyroidism: Secondary | ICD-10-CM

## 2019-01-11 NOTE — Progress Notes (Signed)
she is here today for a post-op visit after undergoing thyroid surgery (thyroid lobectomy) on 12/25/2018.  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  - LYMPHOCYTIC THYROIDITIS.  - NEGATIVE FOR MALIGNANCY   Since surgery, she report doing well.  she report(s) fatigue and some swelling at the surgical site.  She also notes that she is unable to yell.; he  deniespost-op nausea/vomiting, dysphagia, voice changes (only as above.) and pain that is not well controlled. she was not discharged on calcium supplements or thyroid hormone replacement  Physical Exam: Vitals:   01/11/19 1508  BP: 136/88  Pulse: 89  Resp: 14  Temp: 97.9 F (36.6 C)   General:NAD. Speaking voice normal volume and strength. Focused Neck Exam: Mild swelling at incision. Healing ridge present. No concern for infection  Labs: None indicated  Assessment and Plan: The patient is 2 weeks s/p thyroid lobectomy for a symptomatic thyroid nodule.  Doing well postoperatively.Marland Kitchen She was reassured that her ability to project her voice should return to normal with time. 1. Scar care discussed. 2. Calcium taper not required. 3. Check thyroid labs in 4 weeks. 4. Follow up with PCP, Maurice Small, NP, for labs and further care. 5. RTC PRN.

## 2019-01-12 ENCOUNTER — Telehealth: Payer: Self-pay | Admitting: Family Medicine

## 2019-01-12 NOTE — Telephone Encounter (Signed)
Pt said that she has already had a folow up with another doctor but does need to get labs done in 6 wks. Made appt for Feb 24 with labs to be done per patient

## 2019-01-12 NOTE — Telephone Encounter (Signed)
Please call Ms. Hypes to schedule follow up with thyroid labs in about 3-4 weeks. Thanks!

## 2019-02-08 ENCOUNTER — Other Ambulatory Visit: Payer: Self-pay

## 2019-02-08 ENCOUNTER — Encounter: Payer: Self-pay | Admitting: Family Medicine

## 2019-02-08 ENCOUNTER — Ambulatory Visit: Payer: BLUE CROSS/BLUE SHIELD | Admitting: Family Medicine

## 2019-02-08 VITALS — BP 130/70 | HR 70 | Temp 97.9°F | Resp 16 | Ht 66.0 in | Wt 160.0 lb

## 2019-02-08 DIAGNOSIS — E786 Lipoprotein deficiency: Secondary | ICD-10-CM | POA: Diagnosis not present

## 2019-02-08 DIAGNOSIS — G43709 Chronic migraine without aura, not intractable, without status migrainosus: Secondary | ICD-10-CM

## 2019-02-08 DIAGNOSIS — E890 Postprocedural hypothyroidism: Secondary | ICD-10-CM

## 2019-02-08 DIAGNOSIS — M25832 Other specified joint disorders, left wrist: Secondary | ICD-10-CM

## 2019-02-08 DIAGNOSIS — E663 Overweight: Secondary | ICD-10-CM

## 2019-02-08 DIAGNOSIS — E559 Vitamin D deficiency, unspecified: Secondary | ICD-10-CM | POA: Diagnosis not present

## 2019-02-08 DIAGNOSIS — H6981 Other specified disorders of Eustachian tube, right ear: Secondary | ICD-10-CM

## 2019-02-08 DIAGNOSIS — R748 Abnormal levels of other serum enzymes: Secondary | ICD-10-CM | POA: Insufficient documentation

## 2019-02-08 DIAGNOSIS — J309 Allergic rhinitis, unspecified: Secondary | ICD-10-CM

## 2019-02-08 DIAGNOSIS — Z9889 Other specified postprocedural states: Secondary | ICD-10-CM

## 2019-02-08 MED ORDER — SUMATRIPTAN SUCCINATE 6 MG/0.5ML ~~LOC~~ SOLN: 6.0000 mg | SUBCUTANEOUS | 1 refills | Status: AC | PRN

## 2019-02-08 MED ORDER — BACLOFEN 10 MG PO TABS: 10.0000 mg | ORAL_TABLET | Freq: Three times a day (TID) | ORAL | 0 refills | Status: AC

## 2019-02-08 MED ORDER — AZELASTINE-FLUTICASONE 137-50 MCG/ACT NA SUSP: 2.0000 | Freq: Every day | NASAL | 3 refills | Status: DC

## 2019-02-08 MED ORDER — IBUPROFEN 600 MG PO TABS: 600.0000 mg | ORAL_TABLET | Freq: Three times a day (TID) | ORAL | 0 refills | Status: AC | PRN

## 2019-02-08 NOTE — Assessment & Plan Note (Signed)
Add baclofen for muscle tension.  Will take ibuprofen sparingly and always with food.

## 2019-02-08 NOTE — Assessment & Plan Note (Signed)
Discussed importance of 150 minutes of physical activity weekly, eat two servings of fish weekly, eat one serving of tree nuts ( cashews, pistachios, pecans, almonds..) every other day, eat 6 servings of fruit/vegetables daily and drink plenty of water and avoid sweet beverages. 

## 2019-02-08 NOTE — Assessment & Plan Note (Signed)
If inflammatory labs are negative, will refer/consider imaging.  Favor lipoma based on exam.

## 2019-02-08 NOTE — Patient Instructions (Signed)
Please call back if your neck pain is not improved in 2-3 weeks with nasal spray and we will refer you to ENT. Please call back if your wrist pain is not improving in 2-3 weeks with rest and ice and we will refer you.

## 2019-02-08 NOTE — Assessment & Plan Note (Signed)
Labs today, will start synthroid based on labs.

## 2019-02-08 NOTE — Assessment & Plan Note (Signed)
Health teaching provided.  Foods that increase HDL include beans and legumes, whole grains, high-fiber fruits:prunes, apples, and pears; fatty fish- salmon, tuna, sardines; nuts, olive oil.

## 2019-02-08 NOTE — Assessment & Plan Note (Signed)
Call in 2-3 weeks if not improving and will refer to ENT

## 2019-02-08 NOTE — Progress Notes (Signed)
Established Patient Office Visit  Subjective:  Patient ID: Vanessa Callahan, female    DOB: [birth year]  Age: 48 y.o. MRN: [id number]  CC:  Chief Complaint  Patient presents with  . 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 Celine Ahr and is back today for labs.  She is feeling okay right now.  Endorses constipation, dry skin and brittle nails, and some cold intolerance.  No palpitations, no heat/cold intolerance.  Migraine: She is using imitrex PRN, will try to take ibuprofen OTC first and it does not seem to help - theses seem to be very effective for abortive therapy most of the time. She also tried the fioricet for abortive therapy and it did not work.   She isi getting regular massages and they don't always help.  Has tried flexeril and this has not helped - would like to trial new muscle relaxer.  Migraines are typically occurring 3-4 in a month. Her aura is neck tension, but she often wakes with headache too.  She is s/p gastric bypass surgery, takes ibuprofen very sparingly and always with food - discussed risk of GI bleed, and she verbalizes understanding.  Overweight: She is not exercising, is eating a balanced diet.  RIGHT Neck Pain: In the soft tissue from the bottom of the right ear under the right jaw.  She reports has been present since around September 2020.  She does not occasional ability to palpate a lymph node.  She does endorse ongoing nasal congestion and ear congestion. We will trial dymista for 2-3 weeks, and if not improving, will refer to ENT.  Does have dentist appointment this week with Dr. Toy Cookey.   LEFT Wrist Mass: Lateral aspect has soft area of inflammation that is very tender. Has been present for many months.  She has difficulty repetitive motion, especially turning and lifting.   Past Medical History:  Diagnosis Date  . Acute non-recurrent maxillary sinusitis  02/07/2017  . Headache    migraines  . Insomnia   . Sinus infection 12/17/2017   currently on augmentin  . Sleep apnea    h/o had gastric bypass and sleep apnea resolved  . Vitamin D deficiency     Past Surgical History:  Procedure Laterality Date  . ABDOMINAL HYSTERECTOMY    . APPENDECTOMY    . BREAST REDUCTION SURGERY  1999  . GALLBLADDER SURGERY  2018  . GASTRIC BYPASS  2013  . HERNIA REPAIR  1601   umbilical  . THYROID LOBECTOMY Right 12/25/2018   Procedure: THYROID LOBECTOMY WITH INTRAOP NERVE MONITORING;  Surgeon: Fredirick Maudlin, MD;  Location: [facility] ORS;  Service: General;  Laterality: Right;    Family History  Problem Relation Age of Onset  . Cancer Mother   . Diabetes Father   . Hypertension Father   . Hyperlipidemia Father     Social History   Socioeconomic History  . Marital status: Married    Spouse name: Devee  . Number of children: Not on file  . Years of education: Not on file  . Highest education level: Not on file  Occupational History  . Occupation: Freight forwarder  Social Needs  . Financial resource strain: Not on file  . Food insecurity:    Worry: Not on file    Inability: Not on file  . Transportation needs:    Medical: Not on file    Non-medical: Not on  file  Tobacco Use  . Smoking status: Never Smoker  . Smokeless tobacco: Never Used  Substance and Sexual Activity  . Alcohol use: No  . Drug use: Never  . Sexual activity: Yes    Birth control/protection: None  Lifestyle  . Physical activity:    Days per week: Not on file    Minutes per session: Not on file  . Stress: Not on file  Relationships  . Social connections:    Talks on phone: Not on file    Gets together: Not on file    Attends religious service: Not on file    Active member of club or organization: Not on file    Attends meetings of clubs or organizations: Not on file    Relationship status: Not on file  . Intimate partner violence:    Fear of current or ex partner: Not on  file    Emotionally abused: Not on file    Physically abused: Not on file    Forced sexual activity: Not on file  Other Topics Concern  . Not on file  Social History Narrative  . Not on file    Outpatient Medications Prior to Visit  Medication Sig Dispense Refill  . acetaminophen (TYLENOL) 500 MG tablet Take 500 mg by mouth every 6 (six) hours as needed.    . cetirizine (ZYRTEC) 10 MG tablet Take 10 mg by mouth daily as needed.    . butalbital-acetaminophen-caffeine (FIORICET, ESGIC) 50-325-40 MG tablet Take 1-2 tablets by mouth every 8 (eight) hours as needed for migraine. 15 tablet 0  . loratadine-pseudoephedrine (CLARITIN-D 24-HOUR) 10-240 MG 24 hr tablet Take 1 tablet by mouth daily.    . SUMAtriptan (IMITREX) 6 MG/0.5ML SOLN injection Inject 6 mg into the skin every 2 (two) hours as needed for migraine or headache.   1  . pseudoephedrine (SUDAFED) 30 MG tablet Take 30 mg by mouth every 4 (four) hours as needed for congestion.     No facility-administered medications prior to visit.     Allergies  Allergen Reactions  . Sulfa Antibiotics Rash    Redness all over    ROS Review of Systems    Objective:    Physical Exam  Constitutional: She is oriented to person, place, and time. She appears well-developed and well-nourished. No distress.  HENT:  Head: Normocephalic and atraumatic.  Right Ear: External ear normal.  Left Ear: External ear normal.  Nose: Nose normal.  Mouth/Throat: Oropharynx is clear and moist. No oropharyngeal exudate.  Eyes: Pupils are equal, round, and reactive to light. Conjunctivae and EOM are normal.  Neck: Normal range of motion. Neck supple. No JVD present. No thyromegaly present.  Cardiovascular: Normal rate and regular rhythm. Exam reveals no gallop and no friction rub.  No murmur heard. Pulmonary/Chest: Breath sounds normal. She has no wheezes. She has no rales. She exhibits no tenderness.  Musculoskeletal: Normal range of motion.         General: No edema.     Left hand: She exhibits tenderness.       Hands:     Comments: Area marked in red is enlarged, soft but not fluctuant, and no erythema.  +Tenderness.   Lymphadenopathy:    She has no cervical adenopathy.  Neurological: She is alert and oriented to person, place, and time. No cranial nerve deficit.  Skin: Skin is warm and dry. No rash noted.  Psychiatric: She has a normal mood and affect. Her behavior is normal. Judgment and  thought content normal.  Nursing note and vitals reviewed.  BP 130/70 (BP Location: Right Arm, Patient Position: Sitting, Cuff Size: Large)   Pulse 70   Temp 97.9 F (36.6 C) (Oral)   Resp 16   Ht 5' 6" (1.676 m)   Wt 160 lb (72.6 kg)   SpO2 99%   BMI 25.82 kg/m  Wt Readings from Last 3 Encounters:  02/08/19 160 lb (72.6 kg)  01/11/19 152 lb 9.6 oz (69.2 kg)  12/25/18 158 lb 15.2 oz (72.1 kg)   Health Maintenance Due  Topic Date Due  . TETANUS/TDAP  07/26/1990  . PAP SMEAR-Modifier  07/26/1992   There are no preventive care reminders to display for this patient.  Lab Results  Component Value Date   TSH 0.703 10/19/2018   Lab Results  Component Value Date   WBC 9.0 03/02/2018   HGB 13.6 03/02/2018   HCT 39.3 03/02/2018   MCV 87.7 03/02/2018   PLT 301 03/02/2018   Lab Results  Component Value Date   NA 141 03/02/2018   K 4.3 03/02/2018   CO2 28 03/02/2018   GLUCOSE 93 03/02/2018   BUN 10 03/02/2018   CREATININE 0.58 03/02/2018   BILITOT 0.5 03/02/2018   ALKPHOS 90 05/20/2016   AST 15 03/02/2018   ALT 21 03/02/2018   PROT 7.4 03/02/2018   ALBUMIN 4.6 05/20/2016   CALCIUM 9.8 03/02/2018   Lab Results  Component Value Date   CHOL 135 03/02/2018   Lab Results  Component Value Date   HDL 41 (L) 03/02/2018   Lab Results  Component Value Date   LDLCALC 80 03/02/2018   Lab Results  Component Value Date   TRIG 65 03/02/2018   Lab Results  Component Value Date   CHOLHDL 3.3 03/02/2018   No results found  for: HGBA1C    Assessment & Plan:   Problem List Items Addressed This Visit      Cardiovascular and Mediastinum   Migraines    Add baclofen for muscle tension.  Will take ibuprofen sparingly and always with food.      Relevant Medications   ibuprofen (ADVIL,MOTRIN) 600 MG tablet   baclofen (LIORESAL) 10 MG tablet   SUMAtriptan (IMITREX) 6 MG/0.5ML SOLN injection     Respiratory   Allergic rhinitis   Relevant Medications   Azelastine-Fluticasone 137-50 MCG/ACT SUSP     Endocrine   Post-surgical hypothyroidism - Primary    Labs today, will start synthroid based on labs.       Relevant Orders   TSH     Nervous and Auditory   Dysfunction of right eustachian tube    Call in 2-3 weeks if not improving and will refer to ENT      Relevant Medications   Azelastine-Fluticasone 137-50 MCG/ACT SUSP     Other   Overweight (BMI 25.0-29.9)    Discussed importance of 150 minutes of physical activity weekly, eat two servings of fish weekly, eat one serving of tree nuts ( cashews, pistachios, pecans, almonds.Marland Kitchen) every other day, eat 6 servings of fruit/vegetables daily and drink plenty of water and avoid sweet beverages.        Vitamin D deficiency   Relevant Orders   VITAMIN D 25 Hydroxy (Vit-D Deficiency, Fractures)   S/P partial thyroidectomy   Mass of joint of left wrist    If inflammatory labs are negative, will refer/consider imaging.  Favor lipoma based on exam.      Relevant Orders   Sed  Rate (ESR)   C-reactive protein   CBC w/Diff/Platelet   COMPLETE METABOLIC PANEL WITH GFR   Low serum HDL    Health teaching provided.  Foods that increase HDL include beans and legumes, whole grains, high-fiber fruits:prunes, apples, and pears; fatty fish- salmon, tuna, sardines; nuts, olive oil.       Relevant Orders   Lipid panel     Meds ordered this encounter  Medications  . ibuprofen (ADVIL,MOTRIN) 600 MG tablet    Sig: Take 1 tablet (600 mg total) by mouth every 8  (eight) hours as needed.    Dispense:  30 tablet    Refill:  0    Order Specific Question:   Supervising Provider    Answer:   Steele Sizer [[id number]]  . baclofen (LIORESAL) 10 MG tablet    Sig: Take 1 tablet (10 mg total) by mouth 3 (three) times daily.    Dispense:  30 each    Refill:  0    Order Specific Question:   Supervising Provider    Answer:   Steele Sizer [[id number]]  . SUMAtriptan (IMITREX) 6 MG/0.5ML SOLN injection    Sig: Inject 0.5 mLs (6 mg total) into the skin every 2 (two) hours as needed for migraine or headache (Max 2 doses in 24 hours).    Dispense:  6 vial    Refill:  1    Order Specific Question:   Supervising Provider    Answer:   Steele Sizer [[id number]]  . Azelastine-Fluticasone 137-50 MCG/ACT SUSP    Sig: Place 2 sprays into the nose daily.    Dispense:  23 g    Refill:  3    Order Specific Question:   Supervising Provider    Answer:   Steele Sizer [[id number]]   Follow-up: Return in about 3 months (around 05/09/2019) for follow up.    Hubbard Hartshorn, FNP

## 2019-02-09 ENCOUNTER — Telehealth: Payer: Self-pay | Admitting: Family Medicine

## 2019-02-09 ENCOUNTER — Other Ambulatory Visit: Payer: Self-pay | Admitting: Family Medicine

## 2019-02-09 DIAGNOSIS — Z9889 Other specified postprocedural states: Secondary | ICD-10-CM

## 2019-02-09 DIAGNOSIS — E559 Vitamin D deficiency, unspecified: Secondary | ICD-10-CM

## 2019-02-09 DIAGNOSIS — E890 Postprocedural hypothyroidism: Secondary | ICD-10-CM

## 2019-02-09 LAB — COMPLETE METABOLIC PANEL WITH GFR
AG Ratio: 2 (calc) (ref 1.0–2.5)
ALBUMIN MSPROF: 4.5 g/dL (ref 3.6–5.1)
ALT: 20 U/L (ref 6–29)
AST: 23 U/L (ref 10–35)
Alkaline phosphatase (APISO): 91 U/L (ref 31–125)
BILIRUBIN TOTAL: 0.5 mg/dL (ref 0.2–1.2)
BUN: 12 mg/dL (ref 7–25)
CO2: 27 mmol/L (ref 20–32)
Calcium: 9.4 mg/dL (ref 8.6–10.2)
Chloride: 104 mmol/L (ref 98–110)
Creat: 0.61 mg/dL (ref 0.50–1.10)
GFR, Est African American: 125 mL/min/{1.73_m2} (ref >=60)
GFR, Est Non African American: 108 mL/min/{1.73_m2} (ref >=60)
GLUCOSE: 96 mg/dL (ref 65–99)
Globulin: 2.3 g/dL (calc) (ref 1.9–3.7)
Potassium: 4.4 mmol/L (ref 3.5–5.3)
Sodium: 140 mmol/L (ref 135–146)
TOTAL PROTEIN: 6.8 g/dL (ref 6.1–8.1)

## 2019-02-09 LAB — CBC WITH DIFFERENTIAL/PLATELET
Absolute Monocytes: 428 cells/uL (ref 200–950)
Basophils Absolute: 57 cells/uL (ref 0–200)
Basophils Relative: 0.9 %
Eosinophils Absolute: 107 cells/uL (ref 15–500)
Eosinophils Relative: 1.7 %
HCT: 38.8 % (ref 35.0–45.0)
Hemoglobin: 13 g/dL (ref 11.7–15.5)
Lymphs Abs: 1972 cells/uL (ref 850–3900)
MCH: 29.8 pg (ref 27.0–33.0)
MCHC: 33.5 g/dL (ref 32.0–36.0)
MCV: 89 fL (ref 80.0–100.0)
MPV: 11.3 fL (ref 7.5–12.5)
Monocytes Relative: 6.8 %
NEUTROS PCT: 59.3 %
Neutro Abs: 3736 cells/uL (ref 1500–7800)
Platelets: 317 10*3/uL (ref 140–400)
RBC: 4.36 10*6/uL (ref 3.80–5.10)
RDW: 12.9 % (ref 11.0–15.0)
Total Lymphocyte: 31.3 %
WBC: 6.3 10*3/uL (ref 3.8–10.8)

## 2019-02-09 LAB — SEDIMENTATION RATE: Sed Rate: 6 mm/h (ref 0–20)

## 2019-02-09 LAB — LIPID PANEL
Cholesterol: 167 mg/dL (ref <200)
HDL: 53 mg/dL (ref >=50)
LDL Cholesterol (Calc): 98 mg/dL (calc)
Non-HDL Cholesterol (Calc): 114 mg/dL (calc) (ref <130)
Total CHOL/HDL Ratio: 3.2 (calc) (ref <5.0)
Triglycerides: 75 mg/dL (ref <150)

## 2019-02-09 LAB — TSH: TSH: 1.97 mIU/L

## 2019-02-09 LAB — VITAMIN D 25 HYDROXY (VIT D DEFICIENCY, FRACTURES): Vit D, 25-Hydroxy: 14 ng/mL — ABNORMAL LOW (ref 30–100)

## 2019-02-09 LAB — C-REACTIVE PROTEIN

## 2019-02-09 MED ORDER — VITAMIN D (ERGOCALCIFEROL) 1.25 MG (50000 UNIT) PO CAPS: 50000.0000 [IU] | ORAL_CAPSULE | ORAL | 0 refills | Status: AC

## 2019-02-09 NOTE — Telephone Encounter (Signed)
Copied from CRM [id number]. Topic: Quick Communication - Rx Refill/Question >> Feb 09, 2019  4:07 PM Fanny Bien wrote: Medication: Dymista  John calling from [facility] called and stated that the PA for this medication was denied

## 2019-02-10 MED ORDER — FLUTICASONE PROPIONATE 50 MCG/ACT NA SUSP: 2.0000 | Freq: Every day | NASAL | 6 refills | Status: AC

## 2019-02-10 MED ORDER — AZELASTINE HCL 0.1 % NA SOLN: 2.0000 | Freq: Two times a day (BID) | NASAL | 12 refills | Status: AC

## 2019-02-10 NOTE — Telephone Encounter (Signed)
Please change dymista to something else

## 2019-04-14 DIAGNOSIS — M9901 Segmental and somatic dysfunction of cervical region: Secondary | ICD-10-CM | POA: Diagnosis not present

## 2019-04-14 DIAGNOSIS — M531 Cervicobrachial syndrome: Secondary | ICD-10-CM | POA: Diagnosis not present

## 2019-04-14 DIAGNOSIS — M9902 Segmental and somatic dysfunction of thoracic region: Secondary | ICD-10-CM | POA: Diagnosis not present

## 2019-04-14 DIAGNOSIS — M530 Cervicocranial syndrome: Secondary | ICD-10-CM | POA: Diagnosis not present

## 2019-04-15 DIAGNOSIS — M531 Cervicobrachial syndrome: Secondary | ICD-10-CM | POA: Diagnosis not present

## 2019-04-15 DIAGNOSIS — M9901 Segmental and somatic dysfunction of cervical region: Secondary | ICD-10-CM | POA: Diagnosis not present

## 2019-04-15 DIAGNOSIS — M530 Cervicocranial syndrome: Secondary | ICD-10-CM | POA: Diagnosis not present

## 2019-04-15 DIAGNOSIS — M9902 Segmental and somatic dysfunction of thoracic region: Secondary | ICD-10-CM | POA: Diagnosis not present

## 2019-04-16 ENCOUNTER — Other Ambulatory Visit: Payer: Self-pay | Admitting: Otolaryngology

## 2019-04-16 DIAGNOSIS — R221 Localized swelling, mass and lump, neck: Secondary | ICD-10-CM | POA: Diagnosis not present

## 2019-04-16 DIAGNOSIS — H90A21 Sensorineural hearing loss, unilateral, right ear, with restricted hearing on the contralateral side: Secondary | ICD-10-CM | POA: Diagnosis not present

## 2019-04-16 DIAGNOSIS — J301 Allergic rhinitis due to pollen: Secondary | ICD-10-CM | POA: Diagnosis not present

## 2019-04-19 DIAGNOSIS — M531 Cervicobrachial syndrome: Secondary | ICD-10-CM | POA: Diagnosis not present

## 2019-04-19 DIAGNOSIS — M9901 Segmental and somatic dysfunction of cervical region: Secondary | ICD-10-CM | POA: Diagnosis not present

## 2019-04-19 DIAGNOSIS — M9902 Segmental and somatic dysfunction of thoracic region: Secondary | ICD-10-CM | POA: Diagnosis not present

## 2019-04-19 DIAGNOSIS — M530 Cervicocranial syndrome: Secondary | ICD-10-CM | POA: Diagnosis not present

## 2019-04-20 DIAGNOSIS — M531 Cervicobrachial syndrome: Secondary | ICD-10-CM | POA: Diagnosis not present

## 2019-04-20 DIAGNOSIS — M9902 Segmental and somatic dysfunction of thoracic region: Secondary | ICD-10-CM | POA: Diagnosis not present

## 2019-04-20 DIAGNOSIS — M530 Cervicocranial syndrome: Secondary | ICD-10-CM | POA: Diagnosis not present

## 2019-04-20 DIAGNOSIS — M9901 Segmental and somatic dysfunction of cervical region: Secondary | ICD-10-CM | POA: Diagnosis not present

## 2019-04-22 DIAGNOSIS — M531 Cervicobrachial syndrome: Secondary | ICD-10-CM | POA: Diagnosis not present

## 2019-04-22 DIAGNOSIS — M530 Cervicocranial syndrome: Secondary | ICD-10-CM | POA: Diagnosis not present

## 2019-04-22 DIAGNOSIS — M9901 Segmental and somatic dysfunction of cervical region: Secondary | ICD-10-CM | POA: Diagnosis not present

## 2019-04-22 DIAGNOSIS — M9902 Segmental and somatic dysfunction of thoracic region: Secondary | ICD-10-CM | POA: Diagnosis not present

## 2019-04-27 DIAGNOSIS — M9902 Segmental and somatic dysfunction of thoracic region: Secondary | ICD-10-CM | POA: Diagnosis not present

## 2019-04-27 DIAGNOSIS — M9901 Segmental and somatic dysfunction of cervical region: Secondary | ICD-10-CM | POA: Diagnosis not present

## 2019-04-27 DIAGNOSIS — M530 Cervicocranial syndrome: Secondary | ICD-10-CM | POA: Diagnosis not present

## 2019-04-27 DIAGNOSIS — M531 Cervicobrachial syndrome: Secondary | ICD-10-CM | POA: Diagnosis not present

## 2019-05-03 DIAGNOSIS — M25532 Pain in left wrist: Secondary | ICD-10-CM | POA: Diagnosis not present

## 2019-05-03 DIAGNOSIS — S6992XA Unspecified injury of left wrist, hand and finger(s), initial encounter: Secondary | ICD-10-CM | POA: Diagnosis not present

## 2019-05-03 DIAGNOSIS — G8929 Other chronic pain: Secondary | ICD-10-CM | POA: Diagnosis not present

## 2019-05-04 ENCOUNTER — Other Ambulatory Visit: Payer: Self-pay | Admitting: Orthopedic Surgery

## 2019-05-04 DIAGNOSIS — S6992XA Unspecified injury of left wrist, hand and finger(s), initial encounter: Secondary | ICD-10-CM

## 2019-05-04 DIAGNOSIS — M25532 Pain in left wrist: Secondary | ICD-10-CM

## 2019-05-04 DIAGNOSIS — G8929 Other chronic pain: Secondary | ICD-10-CM

## 2019-05-06 ENCOUNTER — Encounter: Payer: Self-pay | Admitting: Family Medicine

## 2019-05-11 ENCOUNTER — Ambulatory Visit: Payer: BLUE CROSS/BLUE SHIELD | Admitting: Family Medicine

## 2019-05-27 ENCOUNTER — Ambulatory Visit: Payer: BLUE CROSS/BLUE SHIELD

## 2019-05-27 ENCOUNTER — Ambulatory Visit
Admission: RE | Admit: 2019-05-27 | Discharge: 2019-05-27 | Disposition: A | Discharge status: Home / Self Care | Payer: BC Managed Care – PPO | Source: Ambulatory Visit | Attending: Orthopedic Surgery | Admitting: Orthopedic Surgery

## 2019-05-27 ENCOUNTER — Other Ambulatory Visit: Payer: BLUE CROSS/BLUE SHIELD

## 2019-05-27 ENCOUNTER — Other Ambulatory Visit: Payer: Self-pay

## 2019-05-27 ENCOUNTER — Ambulatory Visit
Admission: RE | Admit: 2019-05-27 | Discharge: 2019-05-27 | Disposition: A | Discharge status: Home / Self Care | Payer: BC Managed Care – PPO | Source: Ambulatory Visit | Attending: Otolaryngology | Admitting: Otolaryngology

## 2019-05-27 DIAGNOSIS — H90A21 Sensorineural hearing loss, unilateral, right ear, with restricted hearing on the contralateral side: Secondary | ICD-10-CM | POA: Insufficient documentation

## 2019-05-27 DIAGNOSIS — Z9889 Other specified postprocedural states: Secondary | ICD-10-CM | POA: Diagnosis not present

## 2019-05-27 DIAGNOSIS — M25532 Pain in left wrist: Secondary | ICD-10-CM | POA: Insufficient documentation

## 2019-05-27 DIAGNOSIS — H903 Sensorineural hearing loss, bilateral: Secondary | ICD-10-CM | POA: Diagnosis not present

## 2019-05-27 DIAGNOSIS — G8929 Other chronic pain: Secondary | ICD-10-CM | POA: Insufficient documentation

## 2019-05-27 DIAGNOSIS — S6992XA Unspecified injury of left wrist, hand and finger(s), initial encounter: Secondary | ICD-10-CM | POA: Diagnosis not present

## 2019-05-27 DIAGNOSIS — E890 Postprocedural hypothyroidism: Secondary | ICD-10-CM

## 2019-05-27 DIAGNOSIS — M1812 Unilateral primary osteoarthritis of first carpometacarpal joint, left hand: Secondary | ICD-10-CM | POA: Diagnosis not present

## 2019-05-27 DIAGNOSIS — H9319 Tinnitus, unspecified ear: Secondary | ICD-10-CM | POA: Diagnosis not present

## 2019-05-27 MED ORDER — GADOBUTROL 1 MMOL/ML IV SOLN
7.0000 mL | Freq: Once | INTRAVENOUS | Status: AC | PRN
  Administered 2019-05-27: 7 mL via INTRAVENOUS

## 2019-05-28 LAB — TSH: TSH: 2.84 mIU/L

## 2019-06-08 DIAGNOSIS — H90A21 Sensorineural hearing loss, unilateral, right ear, with restricted hearing on the contralateral side: Secondary | ICD-10-CM | POA: Diagnosis not present

## 2019-06-08 DIAGNOSIS — H6061 Unspecified chronic otitis externa, right ear: Secondary | ICD-10-CM | POA: Diagnosis not present

## 2019-06-08 DIAGNOSIS — D164 Benign neoplasm of bones of skull and face: Secondary | ICD-10-CM | POA: Diagnosis not present

## 2019-06-22 DIAGNOSIS — M19032 Primary osteoarthritis, left wrist: Secondary | ICD-10-CM | POA: Diagnosis not present

## 2019-06-22 DIAGNOSIS — G43719 Chronic migraine without aura, intractable, without status migrainosus: Secondary | ICD-10-CM | POA: Diagnosis not present

## 2019-06-22 DIAGNOSIS — M7918 Myalgia, other site: Secondary | ICD-10-CM | POA: Diagnosis not present

## 2019-06-22 DIAGNOSIS — M5481 Occipital neuralgia: Secondary | ICD-10-CM | POA: Diagnosis not present

## 2019-08-23 DIAGNOSIS — Z6826 Body mass index (BMI) 26.0-26.9, adult: Secondary | ICD-10-CM | POA: Diagnosis not present

## 2019-08-23 DIAGNOSIS — Z9884 Bariatric surgery status: Secondary | ICD-10-CM | POA: Diagnosis not present

## 2019-08-23 DIAGNOSIS — R51 Headache: Secondary | ICD-10-CM | POA: Diagnosis not present

## 2019-08-23 DIAGNOSIS — R110 Nausea: Secondary | ICD-10-CM | POA: Diagnosis not present

## 2019-08-23 DIAGNOSIS — H8111 Benign paroxysmal vertigo, right ear: Secondary | ICD-10-CM | POA: Diagnosis not present

## 2019-08-23 DIAGNOSIS — Z9049 Acquired absence of other specified parts of digestive tract: Secondary | ICD-10-CM | POA: Diagnosis not present

## 2019-09-21 DIAGNOSIS — R42 Dizziness and giddiness: Secondary | ICD-10-CM | POA: Diagnosis not present

## 2019-09-21 DIAGNOSIS — M542 Cervicalgia: Secondary | ICD-10-CM | POA: Diagnosis not present

## 2019-09-21 DIAGNOSIS — M5481 Occipital neuralgia: Secondary | ICD-10-CM | POA: Diagnosis not present

## 2019-09-22 DIAGNOSIS — M542 Cervicalgia: Secondary | ICD-10-CM | POA: Diagnosis not present

## 2019-09-22 DIAGNOSIS — M5481 Occipital neuralgia: Secondary | ICD-10-CM | POA: Diagnosis not present

## 2019-10-05 ENCOUNTER — Encounter: Payer: Self-pay | Admitting: Family Medicine

## 2019-10-07 ENCOUNTER — Other Ambulatory Visit
Admission: RE | Admit: 2019-10-07 | Discharge: 2019-10-07 | Disposition: A | Discharge status: Home / Self Care | Payer: BC Managed Care – PPO | Source: Ambulatory Visit | Attending: Otolaryngology | Admitting: Otolaryngology

## 2019-10-07 DIAGNOSIS — R5383 Other fatigue: Secondary | ICD-10-CM | POA: Diagnosis not present

## 2019-10-07 DIAGNOSIS — J301 Allergic rhinitis due to pollen: Secondary | ICD-10-CM | POA: Diagnosis not present

## 2019-10-07 DIAGNOSIS — H8101 Meniere's disease, right ear: Secondary | ICD-10-CM | POA: Diagnosis not present

## 2019-10-07 LAB — TSH: TSH: 1.749 u[IU]/mL (ref 0.350–4.500)

## 2019-10-07 LAB — VITAMIN B12: Vitamin B-12: 173 pg/mL — ABNORMAL LOW (ref 180–914)

## 2019-10-07 LAB — VITAMIN D 25 HYDROXY (VIT D DEFICIENCY, FRACTURES): Vit D, 25-Hydroxy: 23.74 ng/mL — ABNORMAL LOW (ref 30–100)

## 2019-10-09 LAB — ALLERGEN PANEL (27) + IGE
Alternaria Alternata IgE: <0.1 kU/L
Aspergillus Fumigatus IgE: <0.1 kU/L
Bahia Grass IgE: <0.1 kU/L
Bermuda Grass IgE: <0.1 kU/L
Cat Dander IgE: <0.1 kU/L
Cedar, Mountain IgE: <0.1 kU/L
Cladosporium Herbarum IgE: <0.1 kU/L
Cocklebur IgE: <0.1 kU/L
Cockroach, American IgE: <0.1 kU/L
Common Silver Birch IgE: <0.1 kU/L
D Farinae IgE: <0.1 kU/L
D Pteronyssinus IgE: <0.1 kU/L
Dog Dander IgE: <0.1 kU/L
Elm, American IgE: <0.1 kU/L
Hickory, White IgE: <0.1 kU/L
IgE (Immunoglobulin E), Serum: 8 [IU]/mL (ref 6–495)
Johnson Grass IgE: <0.1 kU/L
Kentucky Bluegrass IgE: <0.1 kU/L
Maple/Box Elder IgE: <0.1 kU/L
Mucor Racemosus IgE: <0.1 kU/L
Oak, White IgE: <0.1 kU/L
Penicillium Chrysogen IgE: <0.1 kU/L
Pigweed, Rough IgE: <0.1 kU/L
Plantain, English IgE: <0.1 kU/L
Ragweed, Short IgE: <0.1 kU/L
Setomelanomma Rostrat: <0.1 kU/L
Timothy Grass IgE: <0.1 kU/L
White Mulberry IgE: <0.1 kU/L

## 2019-10-11 ENCOUNTER — Telehealth: Payer: Self-pay | Admitting: Family Medicine

## 2019-10-11 NOTE — Telephone Encounter (Signed)
Received labs from Dr. Pryor Ochoa  B12 - 173 - Offer monthly injections in office if she would like as this is quite low.   TSH - normal  1.749 Vit D - 23.74 -  I suggest you get one bottle of over-the-counter 2,000 iu vitamin D3 and take one a day for the next 3-4 months.  After you finish that course of supplements, just 1,000 iu of vitamin D3 daily is usually sufficient to keep stores adequate. You can also get some vitamin D through sun exposure, spending 15-20 minutes a day outdoors.

## 2019-10-12 NOTE — Telephone Encounter (Signed)
Patient notified

## 2019-10-18 ENCOUNTER — Other Ambulatory Visit: Payer: Self-pay

## 2019-10-18 ENCOUNTER — Ambulatory Visit (INDEPENDENT_AMBULATORY_CARE_PROVIDER_SITE_OTHER): Payer: BC Managed Care – PPO

## 2019-10-18 DIAGNOSIS — H8101 Meniere's disease, right ear: Secondary | ICD-10-CM | POA: Diagnosis not present

## 2019-10-18 DIAGNOSIS — J301 Allergic rhinitis due to pollen: Secondary | ICD-10-CM | POA: Diagnosis not present

## 2019-10-18 DIAGNOSIS — R42 Dizziness and giddiness: Secondary | ICD-10-CM | POA: Diagnosis not present

## 2019-10-18 DIAGNOSIS — D518 Other vitamin B12 deficiency anemias: Secondary | ICD-10-CM | POA: Diagnosis not present

## 2019-10-18 DIAGNOSIS — H6981 Other specified disorders of Eustachian tube, right ear: Secondary | ICD-10-CM | POA: Diagnosis not present

## 2019-10-18 DIAGNOSIS — Z20828 Contact with and (suspected) exposure to other viral communicable diseases: Secondary | ICD-10-CM | POA: Diagnosis not present

## 2019-10-18 MED ORDER — CYANOCOBALAMIN 1000 MCG/ML IJ SOLN
1000.0000 ug | Freq: Once | INTRAMUSCULAR | Status: AC
  Administered 2019-10-18: 1000 ug via INTRAMUSCULAR

## 2019-10-20 DIAGNOSIS — Z20828 Contact with and (suspected) exposure to other viral communicable diseases: Secondary | ICD-10-CM | POA: Diagnosis not present

## 2019-11-29 IMAGING — MR MRI OF THE LEFT WRIST WITHOUT CONTRAST
6 series · 40 of 40 positions shown · non-contrast
Comparison: None.

CLINICAL DATA: Left wrist pain along the radial aspect. Limited
range of motion.

EXAM:
MR OF THE LEFT WRIST WITHOUT CONTRAST
TECHNIQUE: Multiplanar, multisequence MR imaging of the left wrist was
performed. No intravenous contrast was administered.

[Series 4: T1 · axial · left · 2.0mm · 0.47mm/px · z∈[-84,+0]mm · 11 of 40 slices shown (1 of 2)]
[im 1/40]
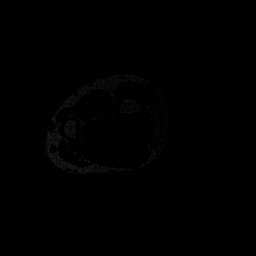
[im 4/40]
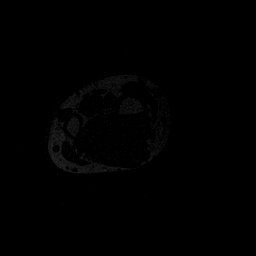
[im 8/40]
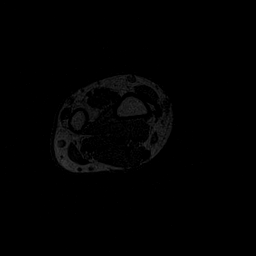
[im 12/40]
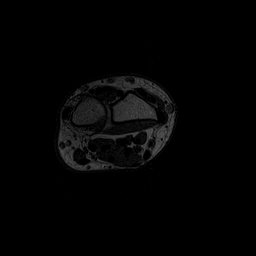
[im 16/40]
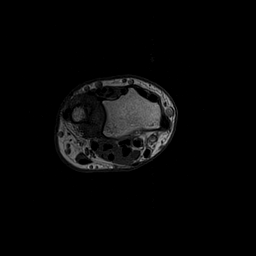
[im 20/40]
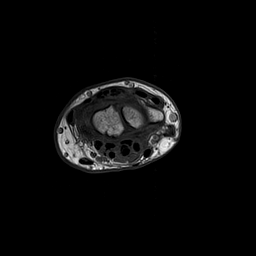
[im 24/40]
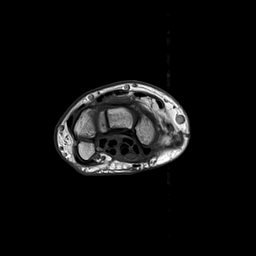
[im 28/40]
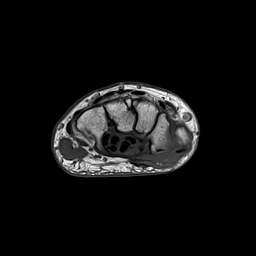
[im 32/40]
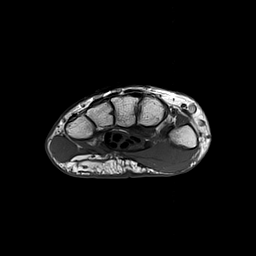
[im 36/40]
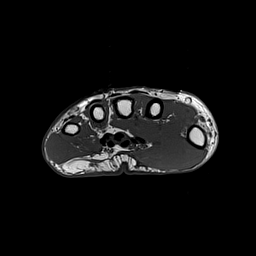
[im 40/40]
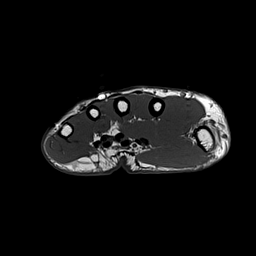

[Series 5: T2 fat-sat · axial · left · 2.0mm · 0.47mm/px · z∈[-84,+0]mm · 11 of 40 slices shown (1 of 2)]
[im 1/40]
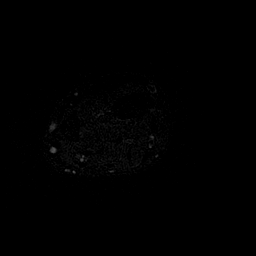
[im 4/40]
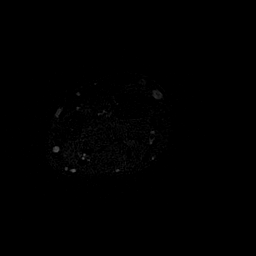
[im 8/40]
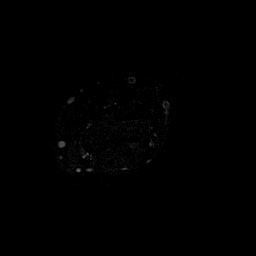
[im 12/40]
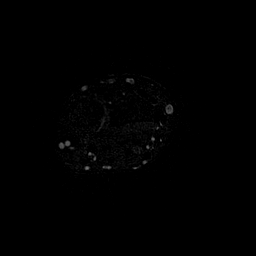
[im 16/40]
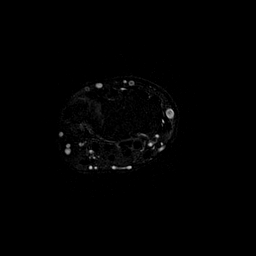
[im 20/40]
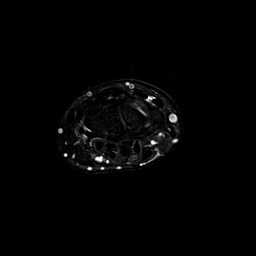
[im 24/40]
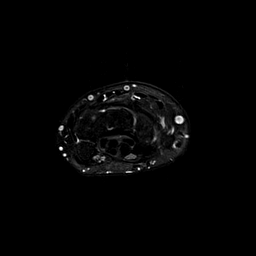
[im 28/40]
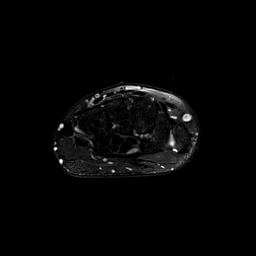
[im 32/40]
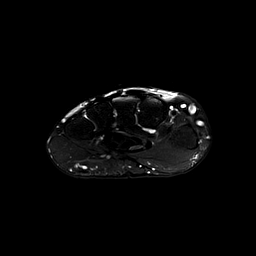
[im 36/40]
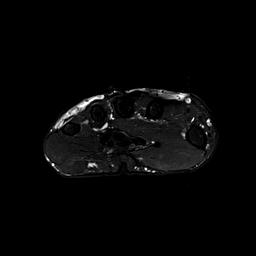
[im 40/40]
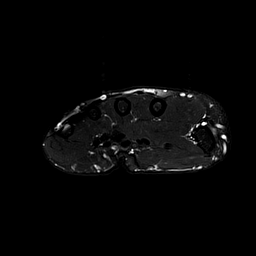

[Series 8: T1 · coronal · left · 3.0mm · 0.39mm/px · 4 of 17 slices shown (2 of 2)]
[im 1/17]
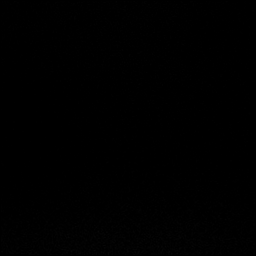
[im 6/17]
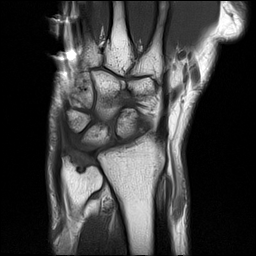
[im 11/17]
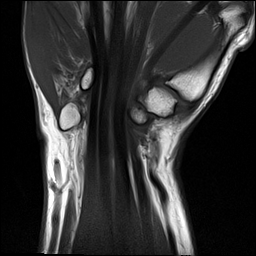
[im 17/17]
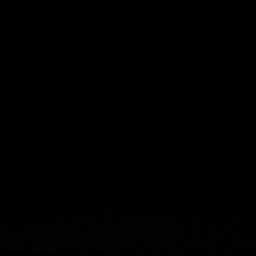

[Series 9: T2 fat-sat · coronal · left · 3.0mm · 0.39mm/px · 4 of 16 slices shown (2 of 2)]
[im 1/16]
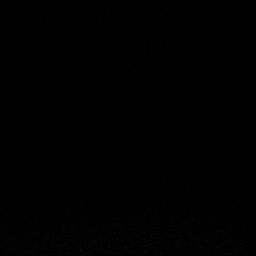
[im 6/16]
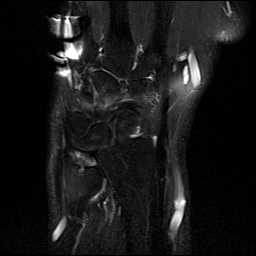
[im 11/16]
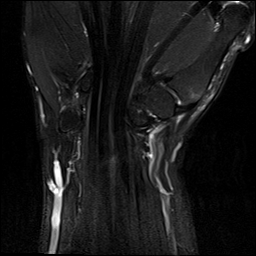
[im 16/16]
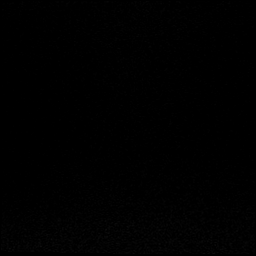

[Series 10: PD fat-sat · coronal · left · 3.0mm · 0.39mm/px · 4 of 17 slices shown (1 of 2)]
[im 1/17]
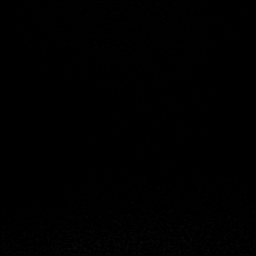
[im 6/17]
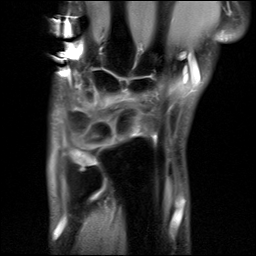
[im 11/17]
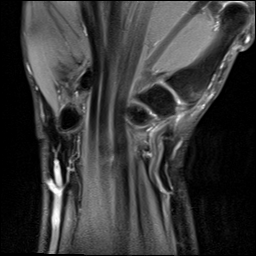
[im 17/17]
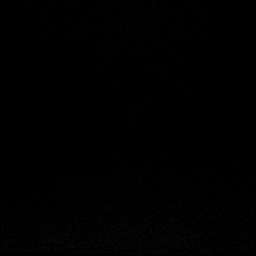

[Series 11: PD fat-sat · sagittal · left · 3.0mm · 0.39mm/px · 6 of 22 slices shown (2 of 2)]
[im 1/22]
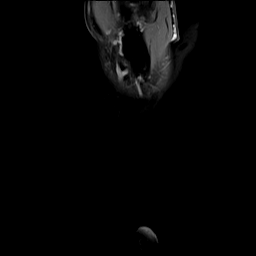
[im 5/22]
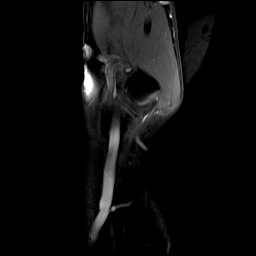
[im 9/22]
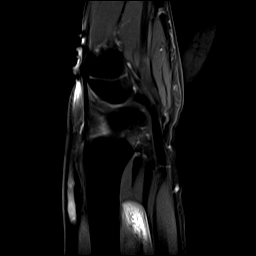
[im 13/22]
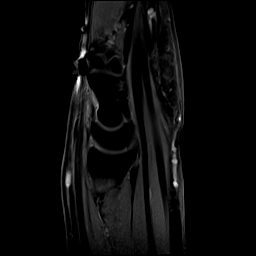
[im 17/22]
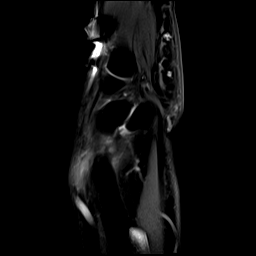
[im 22/22]
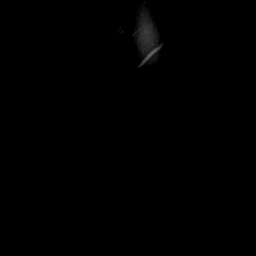

[40 of 40 positions shown; findings below may reference images not displayed]

FINDINGS: Ligaments: Intact scapholunate and lunotriquetral ligaments.

Triangular fibrocartilage: Intact TFCC.

Tendons: Flexor and extensor compartment tendons are intact.

Carpal tunnel/median nerve: Normal carpal tunnel. Normal median
nerve.

Guyon's canal: Normal.

Joint/cartilage: No joint effusion. No synovitis. Partial-thickness
cartilage loss of the scaphotrapeziotrapezoid joint and first CMC
joint.

Bones/carpal alignment: No acute osseous abnormality. No aggressive
osseous lesion.

Other: No fluid collection or hematoma.
IMPRESSION: 1. No acute injury of the left wrist.
2. No ligamentous injury of the left wrist.
3. Mild osteoarthritis of the scaphotrapeziotrapezoid joint and
first CMC joint.

## 2019-11-29 IMAGING — MR MR BRAIN/IAC WITHOUT AND WITH CONTRAST
9 of 13 series · 25 of 48 positions shown · IV contrast (gadavist)
Comparison: None.

CLINICAL DATA: 47-year-old female with right side hearing loss.
Sensorineural hearing loss. Tinnitus.

EXAM:
MRI HEAD WITHOUT AND WITH CONTRAST
TECHNIQUE: Multiplanar, multiecho pulse sequences of the brain and surrounding
structures were obtained without and with intravenous contrast.
CONTRAST:  7 milliliters Gadavist.

[Series 5: T1 · sagittal · 5.0mm · 0.62mm/px · 3 of 23 slices shown (1 of 3)]
[im 1/23]
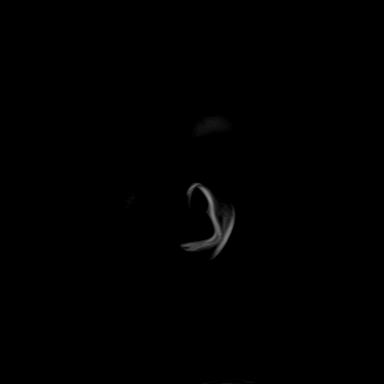
[im 12/23]
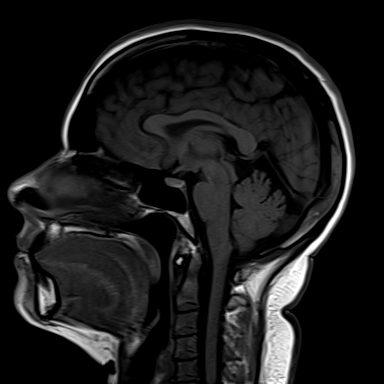
[im 23/23]
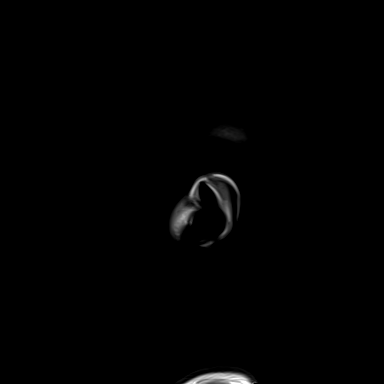

[Series 6: ax dwi_tracew · axial · 3.0mm · 0.73mm/px · z∈[-61,+20]mm · 3 of 55 slices shown]
[im 1/55]
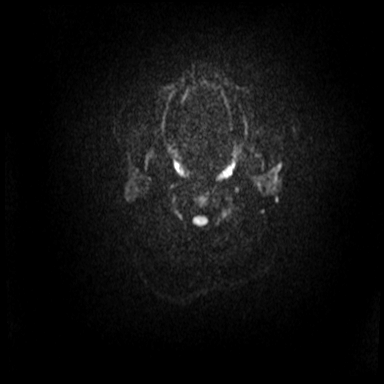
[im 14/55]
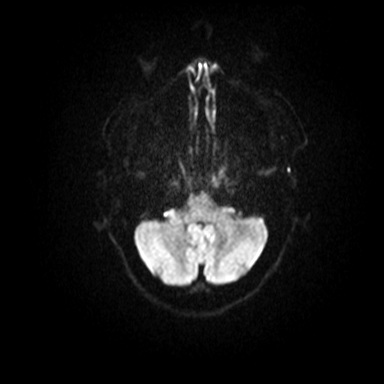
[im 28/55]
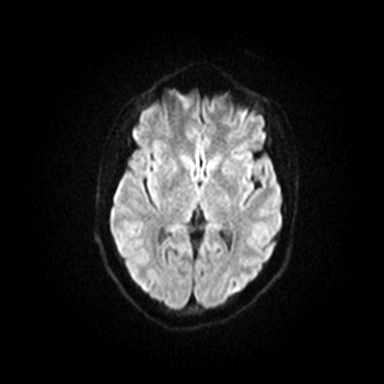

[Series 8: T2 · axial · 5.0mm · 0.53mm/px · z∈[-58,+98]mm · 2 of 27 slices shown]
[im 1/27]
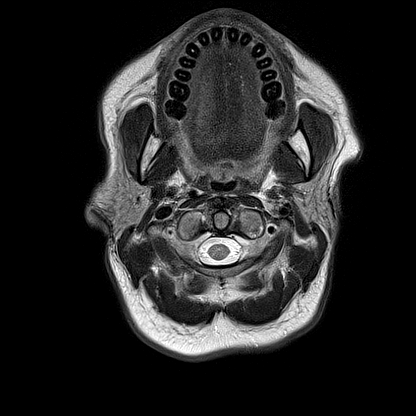
[im 27/27]
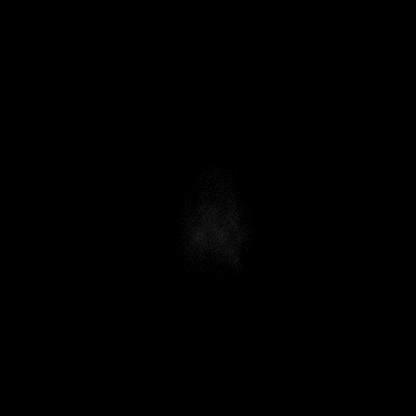

[Series 13: T1 · coronal · non-contrast · 3.0mm · 0.21mm/px · 1 of 13 slices shown (2 of 3)]
[im 1/13]
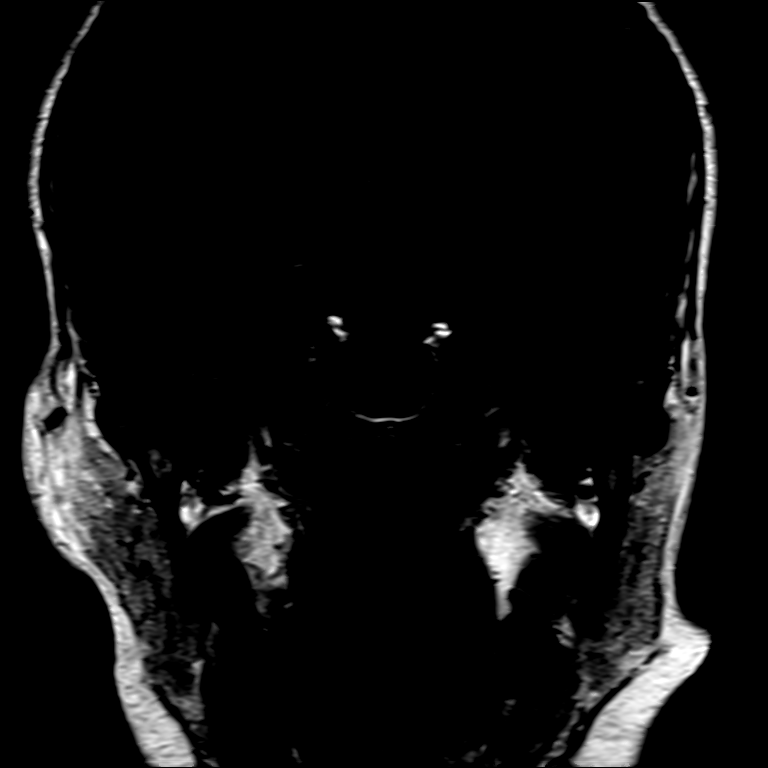

[Series 14: FLAIR · axial · 3.0mm · 0.53mm/px · z∈[-61,+100]mm · 4 of 55 slices shown]
[im 1/55]
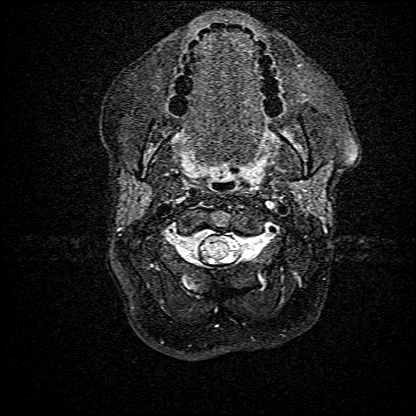
[im 19/55]
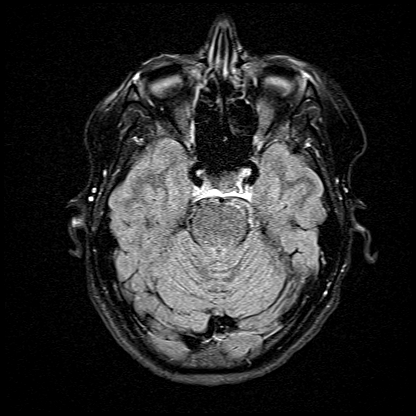
[im 37/55]
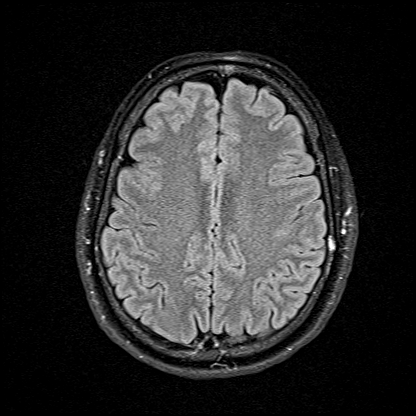
[im 55/55]
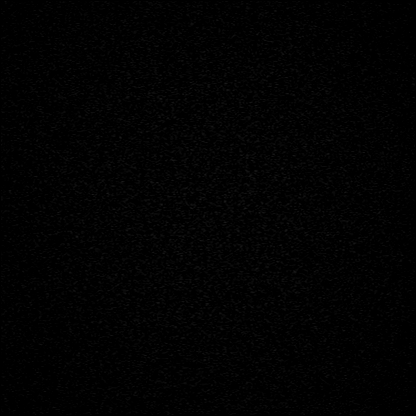

[Series 16: T1 · axial · non-contrast · 3.0mm · 0.21mm/px · 1 of 15 slices shown (3 of 3)]
[im 1/15]
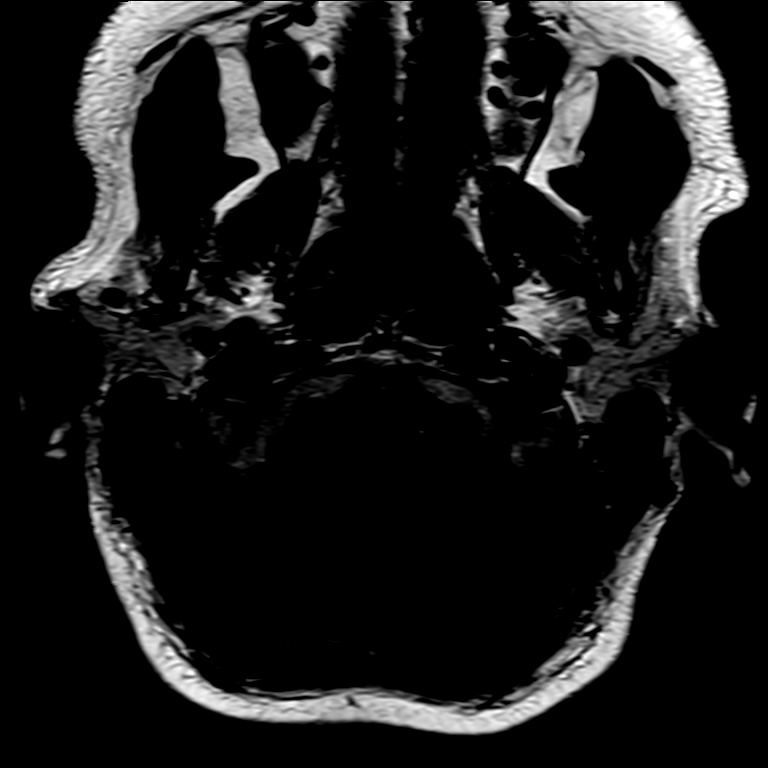

[Series 17: T1 post-contrast · axial · 3.0mm · 0.21mm/px · 1 of 15 slices shown (1 of 3)]
[im 1/15]
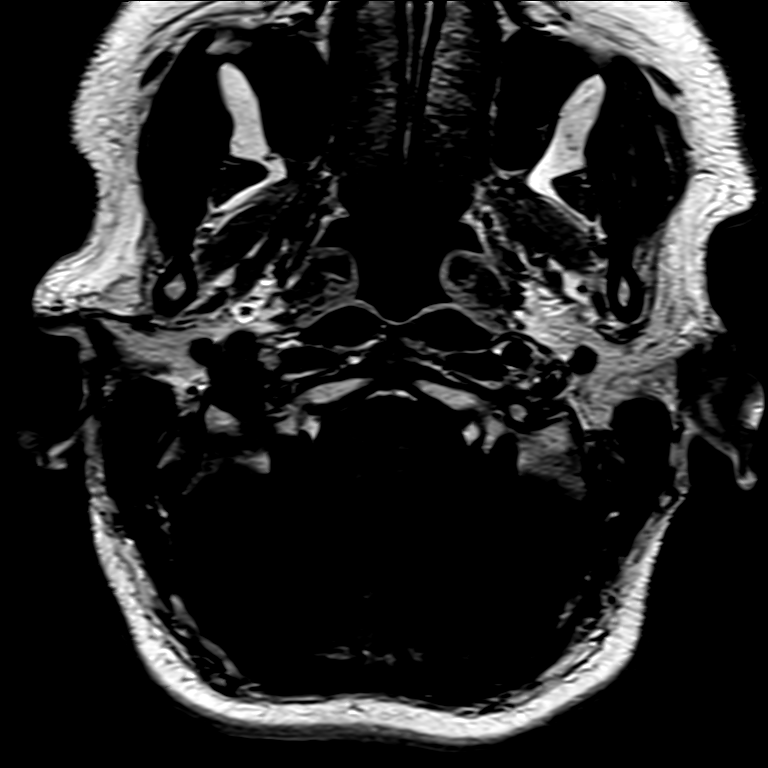

[Series 18: T1 post-contrast · coronal · 3.0mm · 0.21mm/px · 1 of 13 slices shown (2 of 3)]
[im 1/13]
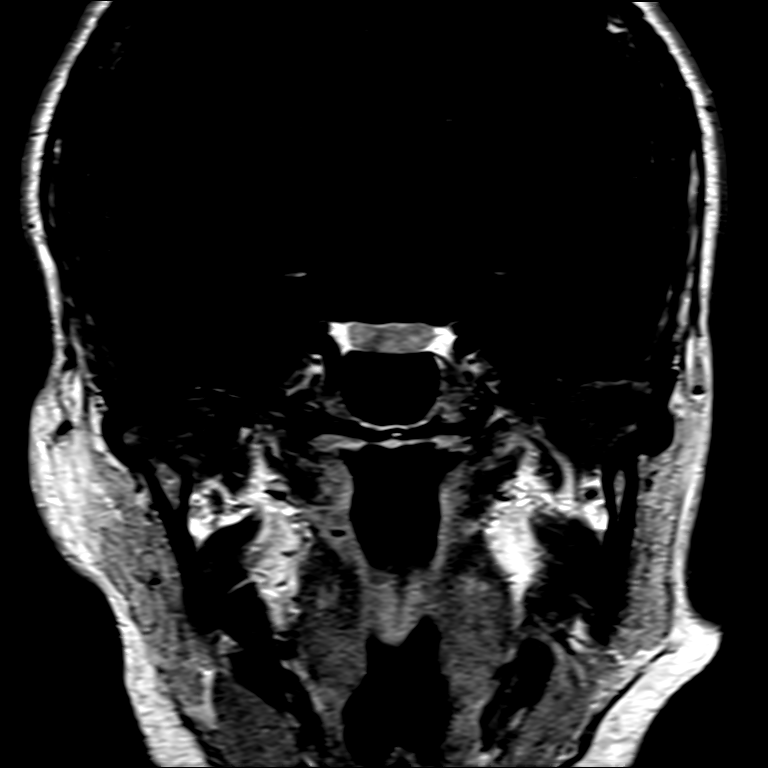

[Series 19: T1 post-contrast · axial · 1.0mm · 0.98mm/px · z∈[-60,+97]mm · 9 of 159 slices shown (3 of 3)]
[im 1/159]
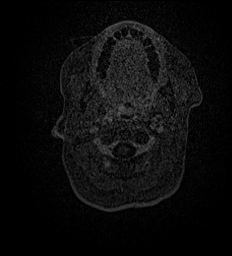
[im 27/159]
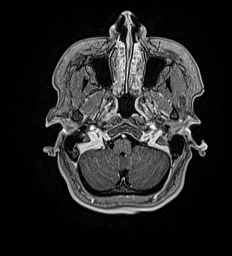
[im 53/159]
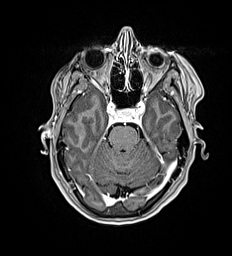
[im 66/159]
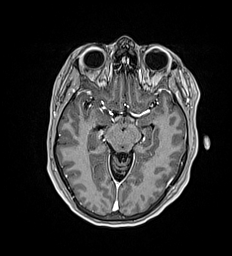
[im 80/159]
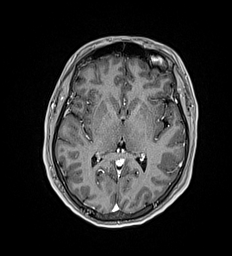
[im 93/159]
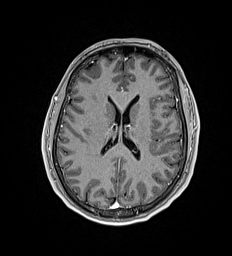
[im 106/159]
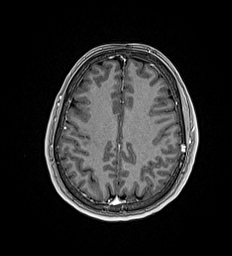
[im 132/159]
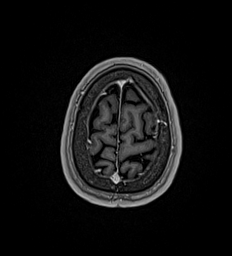
[im 159/159]
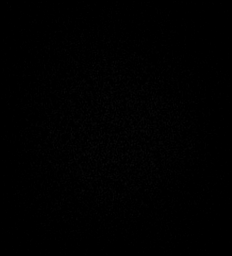

[25 of 48 positions shown; findings below may reference images not displayed]

FINDINGS: Brain: Normal cerebral volume. No restricted diffusion to suggest
acute infarction. No midline shift, mass effect, evidence of mass
lesion, ventriculomegaly, extra-axial collection or acute
intracranial hemorrhage. Cervicomedullary junction and pituitary are
within normal limits.

Gray and white matter signal is within normal limits for age
throughout the brain. No cortical encephalomalacia. No chronic blood
products. No abnormal intracranial enhancement. No dural thickening
identified.

Vascular: Major intracranial vascular flow voids are preserved, the
distal left vertebral artery appears dominant.

Skull and upper cervical spine: Negative visible cervical spine. In
general bone mineralization is normal. However, there is a small 12
millimeter left lateral calvarium bone lesion with T2 and FLAIR
hyperintensity (series 8, image 17), and mildly abnormal diffusion
(series 6, image 34). Following contrast the lesion enhances
intensely, but has a stippled internal appearance (series 18, image
1). It appears slightly expansile.

Sinuses/Orbits: Negative orbits.  Paranasal sinuses are clear.

Scalp and face soft tissues appear negative.

Other: Dedicated internal auditory canal imaging. Normal
cerebellopontine angles. Normal bilateral cisternal and
intracanalicular 7th and 8th cranial nerve segments. Symmetric T2
signal in the bilateral cochlea and vestibular structures. No
abnormal enhancement identified. Mastoids are clear. Normal
stylomastoid foramina. Normal visible cavernous sinus. No skull base
abnormality. Both parotid glands appear normal.
IMPRESSION: 1. Negative IAC imaging.
2. Normal for age MRI appearance of the brain.
3. Small bone lesion along the left lateral calvarium is probably a
benign osseous hemangioma (12 mm - series 14, image 34). Although
this appears slightly expanded, so long as there is no pain
referable to this site then clinical significance is doubtful. A
repeat brain MRI (noncontrast should suffice) could be obtained in
6-9 months to document size stability.

## 2019-12-06 DIAGNOSIS — F5104 Psychophysiologic insomnia: Secondary | ICD-10-CM | POA: Diagnosis not present

## 2019-12-06 DIAGNOSIS — M5481 Occipital neuralgia: Secondary | ICD-10-CM | POA: Diagnosis not present

## 2019-12-06 DIAGNOSIS — G43719 Chronic migraine without aura, intractable, without status migrainosus: Secondary | ICD-10-CM | POA: Diagnosis not present

## 2019-12-06 DIAGNOSIS — M7918 Myalgia, other site: Secondary | ICD-10-CM | POA: Diagnosis not present

## 2019-12-14 DIAGNOSIS — Z20828 Contact with and (suspected) exposure to other viral communicable diseases: Secondary | ICD-10-CM | POA: Diagnosis not present

## 2021-01-29 ENCOUNTER — Telehealth: Payer: Self-pay

## 2021-01-29 NOTE — Telephone Encounter (Signed)
Received v/m from patient to return her call. No answer, left message to return my call.

## 2021-09-14 ENCOUNTER — Encounter: Payer: Self-pay | Admitting: General Surgery

## 2022-03-14 ENCOUNTER — Ambulatory Visit: Payer: BC Managed Care – PPO | Admitting: Adult Health
